# Patient Record
Sex: Male | Born: 1945
Health system: Southern US, Community
[De-identification: ages and names within clinical notes are randomized; demographics above are authoritative.]

## PROBLEM LIST (undated history)

## (undated) DIAGNOSIS — R05 Cough: Secondary | ICD-10-CM

## (undated) DIAGNOSIS — K30 Functional dyspepsia: Secondary | ICD-10-CM

## (undated) DIAGNOSIS — I200 Unstable angina: Secondary | ICD-10-CM

## (undated) DIAGNOSIS — Z9889 Other specified postprocedural states: Secondary | ICD-10-CM

## (undated) DIAGNOSIS — I4510 Unspecified right bundle-branch block: Secondary | ICD-10-CM

## (undated) DIAGNOSIS — R011 Cardiac murmur, unspecified: Secondary | ICD-10-CM

## (undated) DIAGNOSIS — I214 Non-ST elevation (NSTEMI) myocardial infarction: Secondary | ICD-10-CM

## (undated) DIAGNOSIS — R0789 Other chest pain: Secondary | ICD-10-CM

## (undated) DIAGNOSIS — G629 Polyneuropathy, unspecified: Secondary | ICD-10-CM

## (undated) DIAGNOSIS — IMO0001 Reserved for inherently not codable concepts without codable children: Secondary | ICD-10-CM

## (undated) DIAGNOSIS — I340 Nonrheumatic mitral (valve) insufficiency: Secondary | ICD-10-CM

## (undated) DIAGNOSIS — I071 Rheumatic tricuspid insufficiency: Secondary | ICD-10-CM

## (undated) DIAGNOSIS — R208 Other disturbances of skin sensation: Secondary | ICD-10-CM

## (undated) DIAGNOSIS — E785 Hyperlipidemia, unspecified: Secondary | ICD-10-CM

## (undated) HISTORY — DX: Unspecified right bundle-branch block: I45.10

## (undated) HISTORY — DX: Hyperlipidemia, unspecified: E78.5

## (undated) HISTORY — DX: Other disturbances of skin sensation: R20.8

## (undated) HISTORY — DX: Cardiac murmur, unspecified: R01.1

## (undated) HISTORY — PX: SPINE SURGERY: SHX786

## (undated) HISTORY — DX: Other chest pain: R07.89

## (undated) HISTORY — DX: Rheumatic tricuspid insufficiency: I07.1

## (undated) HISTORY — DX: Reserved for inherently not codable concepts without codable children: IMO0001

## (undated) HISTORY — DX: Other specified postprocedural states: Z98.890

## (undated) HISTORY — DX: Cough: R05

## (undated) HISTORY — DX: Polyneuropathy, unspecified: G62.9

## (undated) HISTORY — DX: Nonrheumatic mitral (valve) insufficiency: I34.0

## (undated) HISTORY — DX: Functional dyspepsia: K30

---
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
## 2001-05-29 ENCOUNTER — Observation Stay (HOSPITAL_COMMUNITY): Admission: RE | Admit: 2001-05-29 | Discharge: 2001-05-30 | Payer: Self-pay | Admitting: Neurosurgery

## 2001-06-28 ENCOUNTER — Encounter: Admission: RE | Admit: 2001-06-28 | Discharge: 2001-06-28 | Payer: Self-pay | Admitting: Neurosurgery

## 2002-07-26 DIAGNOSIS — IMO0001 Reserved for inherently not codable concepts without codable children: Secondary | ICD-10-CM

## 2002-07-26 HISTORY — DX: Reserved for inherently not codable concepts without codable children: IMO0001

## 2005-01-28 ENCOUNTER — Encounter: Admission: RE | Admit: 2005-01-28 | Discharge: 2005-01-28 | Payer: Self-pay | Admitting: Emergency Medicine

## 2005-05-31 DIAGNOSIS — IMO0001 Reserved for inherently not codable concepts without codable children: Secondary | ICD-10-CM

## 2005-05-31 HISTORY — DX: Reserved for inherently not codable concepts without codable children: IMO0001

## 2006-12-02 ENCOUNTER — Encounter: Admission: RE | Admit: 2006-12-02 | Discharge: 2006-12-02 | Payer: Self-pay | Admitting: Emergency Medicine

## 2009-06-12 ENCOUNTER — Ambulatory Visit: Payer: Self-pay | Admitting: Vascular Surgery

## 2009-09-10 ENCOUNTER — Encounter: Admission: RE | Admit: 2009-09-10 | Discharge: 2009-09-10 | Payer: Self-pay | Admitting: Emergency Medicine

## 2010-03-04 ENCOUNTER — Inpatient Hospital Stay (HOSPITAL_COMMUNITY): Admission: RE | Admit: 2010-03-04 | Discharge: 2010-03-04 | Payer: Self-pay | Admitting: Neurosurgery

## 2010-12-21 LAB — SURGICAL PCR SCREEN

## 2010-12-21 LAB — BASIC METABOLIC PANEL
GFR calc non Af Amer: >60 mL/min (ref >60)
Glucose, Bld: 89 mg/dL (ref 70–99)
Potassium: 4.1 mEq/L (ref 3.5–5.1)
Sodium: 137 mEq/L (ref 135–145)

## 2010-12-21 LAB — CBC
Hemoglobin: 13.8 g/dL (ref 13.0–17.0)
MCHC: 34.7 g/dL (ref 30.0–36.0)
RBC: 4.4 MIL/uL (ref 4.22–5.81)

## 2011-02-16 NOTE — Procedures (Signed)
CAROTID DUPLEX EXAM   INDICATION:  Carotid bruit.   HISTORY:  Diabetes:  No.  Cardiac:  No.  Hypertension:  No.  Smoking:  Previous.  Previous Surgery:  No.  CV History:  Currently asymptomatic.  Amaurosis Fugax No, Paresthesias No, Hemiparesis No                                       RIGHT             LEFT  Brachial systolic pressure:         134               132  Brachial Doppler waveforms:         Normal            Normal  Vertebral direction of flow:        Antegrade         Antegrade  DUPLEX VELOCITIES (cm/sec)  CCA peak systolic                   138               137  ECA peak systolic                   107               133  ICA peak systolic                   100               150  ICA end diastolic                   30                36  PLAQUE MORPHOLOGY:                  Mixed             Mixed  PLAQUE AMOUNT:                      Mild              Mild/moderate  PLAQUE LOCATION:                    ECA               ICA/ECA   IMPRESSION:  1. No evidence of stenosis noted in the right internal carotid artery.  2. 40-59% stenosis of the left internal carotid artery.  3. A preliminary report was faxed to Dr. Harvie Bridge office on      06/12/2009.   ___________________________________________  Quita Skye. Hart Rochester, M.D.   CH/MEDQ  D:  06/13/2009  T:  06/13/2009  Job:  [id number]

## 2011-02-19 NOTE — Op Note (Signed)
[place]  Patient:    Mark Garner, Mark Garner Visit Number: [id number] MRN: [id number]          Service Type: SUR Location: 3000 3022 01 Attending Physician:  Cristi Loron Dictated by:   Cristi Loron, M.D. Proc. Date: 05/29/01 Admit Date:  05/29/2001 Discharge Date: 05/30/2001                             Operative Report  BRIEF HISTORY:  The patient is a 65 year old white male who has suffered from neck and right arm pain.  He has failed medical management and was worked up as an outpatient with a cervical MRI that demonstrates significant spondylosis and neural foraminal stenosis at C5-6.  The patient, therefore, weighed the risks, benefits, and alternatives to surgery and decided to proceed with the C5-6 anterior cervical diskectomy with fusion and plating.  PREOPERATIVE DIAGNOSES:   C5-6 spondylosis, spinal stenosis, degenerative disk disease, cervical radiculopathy and cervicalgia.  POSTOPERATIVE DIAGNOSES:  C5-6 spondylosis, spinal stenosis, degenerative disk disease, cervical radiculopathy and cervicalgia.  OPERATION:  C5-6 extensive anterior cervical diskectomy, interbody iliac crest allograft arthrodesis, anterior cervical plating (Codman titanium plate and screws).  SURGEON:  Cristi Loron, M.D.  ASSISTANT:  Stefani Dama, M.D.  ANESTHESIA:  General endotracheal anesthesia.  ESTIMATED BLOOD LOSS:  Less than 100 cc.  SPECIMENS:  None.  DRAINS:  None.  COMPLICATIONS:  None.  DESCRIPTION OF PROCEDURE:  The patient was brought to the operating room by anesthesia team.  General endotracheal anesthesia was induced.  The patient remained in a supine position.  A roll was placed under his shoulders to place his neck in slight extension.  His anterior cervical region was then prepared with Betadine scrub and Betadine solution.  Sterile drapes were applied.  I then injected the area to be incised with Marcaine with  epinephrine solution. I then used a scalpel to make a transverse incision in the patients left anterior neck.  I used the Metzenbaum scissors to divide the the platysma muscle and then dissected medial to the sternocleidomastoid muscle, jugular vein, and carotid artery.  I bluntly dissected towards the anterior cervical spine, identified the esophagus, and carefully retracted it medially.  I then used the Kitner gloves to remove some soft tissue from the anterior cervical spine and inserted a spinal needle into the exposed interspace.  I obtained intraoperative radiograph that demonstrated the needle was at C5-6.  I detached the medial border of the longus coli muscle bilaterally from the C5-6 interspace and inserted the Caspar self-retaining retractor for exposure. I then incised the C5-6 intervertebral disk and performed a partial diskectomy using a pituitary forceps.  There was considerable anterior spondylosis.  I used a high-speed drill to remove some anterior bone spurs to gain better access to the interspace.  I inserted distraction screws at C5 and C6 and distracted the interspace and used the high-speed drill to decorticate the vertebral end plates at A2-1 and drill away the remainder of the intervertebral disk.  I thinned out the posterior and longitudinal ligament with the drill, and then I incised it with the arachnoid knife.  I removed the ligament with the Kerrison punch, undercutting the vertebral end plates at H0-8, removing some more spondylosis.  I identified the bilateral C6 nerve roots and performed a generous foraminotomy about the nerve root bilaterally. At this point, I had a good decompression of the thecal sac  and the bilateral C6 nerve roots.  I then turned my attention to the arthrodesis.  I obtained a 9 mm iliac crest bone graft and fashioned it to these approximate dimensions: 9 mm in height, 1 cm in depth.  I inserted the bone graft and distracted the C5-6  interspace, removed the distraction screws.  There was a good snug fit of the bone graft.  Having completed the arthrodesis, I now turned my attention to the anterior spine.  I obtained the appropriate length Codman anterior cervical plate, laid it along the anterior aspect of the vertebral bodies at C5-6, drilled two holes at C5, two holes at C6, tapped the holes and secured the plate to the vertebral bodies using 12 mm screws.  I then obtained the intraoperative radiograph which demonstrated good positioning of plate, screws, and interbody graft.  I then secured the screws to the plate using a cam tightener.  I copiously irrigated the wound with bacitracin solution, removed the solution, and achieved good hemostasis with bipolar electrocautery and Gelfoam.  I then removed the Caspar self-retaining retractor and inspected the esophagus for any damage.  There was none apparent.  I then reapproximated the patients platysma muscle with interrupted 3-0 Vicryl suture, subcutaneous tissue with interrupted 3-0 Vicryl suture, and the skin with Steri-Strips and benzoin. The wound was then coated with bacitracin ointment.  A sterile dressing was applied.  The drapes were removed, and the patient was subsequently extubated by the anesthesia team and transported to the postanesthesia care unit in stable condition.  All sponge, instrument, and needle counts were correct at the end of this case. Dictated by:   Cristi Loron, M.D. Attending Physician:  Tressie Stalker D DD:  05/29/01 TD:  05/29/01 Job: [id number]

## 2011-02-19 NOTE — H&P (Signed)
[place]  Patient:    Mark Garner, Mark Garner Visit Number: [id number] MRN: [id number]          Service Type: SUR Location: 3000 3022 01 Attending Physician:  Cristi Loron Dictated by:   Cristi Loron, M.D. Adm. Date:  05/29/2001                           History and Physical  CHIEF COMPLAINT: Neck pain, right arm pain.  HISTORY OF PRESENT ILLNESS: The patient is a 65 year old white male, who has had trouble with right-sided neck pain radiating down his right arm for years. He has been treated medically and has failed medical management.  He was worked up with a cervical MRI, which demonstrated significant spondylosis and neural foraminal stenosis at C5-6 on the right.  The patient therefore weighed the risks and benefits and alternatives of surgery and decided to proceed with anterior cervical diskectomy with fusion and plating.  PAST MEDICAL HISTORY: Gastritis.  PAST SURGICAL HISTORY: None.  MEDICATIONS PRIOR TO ADMISSION: P.r.n. vitamins.  ALLERGIES: No known drug allergies.  FAMILY HISTORY: The patients mother is age 67 and in good health.  The patients father drowned at age 80.  He has one brother with a heart attack and diabetes mellitus, another brother with a TIA.  SOCIAL HISTORY: The patient is married.  He has one son.  He lives in [place].  He denies tobacco and drug use.  He rarely drinks alcohol.  REVIEW OF SYSTEMS: Negative except as above.  PHYSICAL EXAMINATION:  GENERAL: Pleasant, well-nourished, well-developed 65 year old white male complaining of neck and right arm pain.  VITAL SIGNS: Height 5 feet 9 inches.  Weight 160 pounds.  HEENT: Normal.  NECK: Supple.  No masses, deformities, tracheal deviation, jugular venous distention.  He has mildly decreased cervical range of motion.  Spurlings test positive on the right, negative on the left.  Lhermittes sign not present.  Reflexes  symmetric.  LUNGS: Clear to auscultation.  HEART: Regular rate and rhythm.  ABDOMEN: Normal.  EXTREMITIES: Good bilateral shoulder and elbow range of motion.  No tenderness to palpation or deformities.  BACK: Normal.  NEUROLOGIC: The patient is alert and oriented x 3.  Cranial nerves grossly intact bilaterally.  Vision and hearing loss grossly normal bilaterally. Motor strength is 5/5 at bilateral deltoids, biceps, triceps, hand grips, wrist extensors, interosseous psoas, quadriceps, gastrocnemius, and extensor hallucis longus.  Cerebellar examination intact to rapid alternating movements of the upper extremities bilaterally.  Sensory examination was normal to light touch and pinprick sensation in all tested dermatomes bilaterally.  Deep tendon reflexes were 2/4 in the bilateral biceps, triceps, brachial radialis, quadriceps, and gastrocnemius.  He has bilateral flexor and plantar reflexes. No ankle clonus.  DIAGNOSTIC STUDIES: Cervical MRI performed without contrast on April 16, 2001 at [facility] demonstrated cervical spine with significant spondylosis and right neural foraminal stenosis at C5-6, some mild spondylosis at C6-7 without significant neural compression.  ASSESSMENT/PLAN: C5-6 degenerative disk disease with spondylosis and spinal stenosis at the cervical canal, with degenerative cervical radiation.  I have discussed the situation with the patient and reviewed his MRI scan with him, pointing out the abnormalities.  His signs and symptoms and physical examination were consistent with a right C6 radiculopathy caused by bone spur at C5-6.  He has not improved despite time and medical management and we discussed the treatment options of continued medical management,  doing nothing, as well as surgery.  I described the procedure of a C5-6 anterior cervical diskectomy with interbody iliac crest allograft arthrodesis with anterior cervical plating.  I have shown him  surgical models and discussed the risks of surgery extensively.  The patient has weighed the risks and benefits and alternatives of surgery, and wants to proceed with C5-6 anterior cervical diskectomy with interbody iliac crest allograft arthrodesis with anterior cervical plating on May 29, 2001. Dictated by:   Cristi Loron, M.D. Attending Physician:  Tressie Stalker D DD:  05/29/01 TD:  05/29/01 Job: [id number]

## 2011-03-16 ENCOUNTER — Encounter: Payer: Self-pay | Admitting: Cardiovascular Disease

## 2011-03-16 ENCOUNTER — Telehealth: Payer: Self-pay | Admitting: Cardiovascular Disease

## 2011-03-16 NOTE — Telephone Encounter (Signed)
Called stating he has noticed recently that he gets more SOB when rides bicycle and some occ chest pressure. First app w/ Dr. Elease Hashimoto is in July. Offered him an app w/ Lawson Fiscal. See will see him 6/15. Had nml stress and echo.

## 2011-03-16 NOTE — Telephone Encounter (Signed)
PT MADE APPT FOR July (1ST AVAILABLE) BUT SAYS HE IS HAVING SOME ISSUES AND WANTS TO RUN THEM BY THE NURSE. CHART IN BOX.

## 2011-03-19 ENCOUNTER — Ambulatory Visit: Payer: Self-pay | Admitting: Nurse Practitioner

## 2011-03-30 ENCOUNTER — Encounter: Payer: Self-pay | Admitting: Cardiovascular Disease

## 2011-04-06 ENCOUNTER — Ambulatory Visit (INDEPENDENT_AMBULATORY_CARE_PROVIDER_SITE_OTHER): Payer: Medicare Other | Admitting: Cardiovascular Disease

## 2011-04-06 ENCOUNTER — Encounter: Payer: Self-pay | Admitting: Cardiovascular Disease

## 2011-04-06 DIAGNOSIS — R0789 Other chest pain: Secondary | ICD-10-CM | POA: Insufficient documentation

## 2011-04-06 DIAGNOSIS — R079 Chest pain, unspecified: Secondary | ICD-10-CM

## 2011-04-06 HISTORY — DX: Other chest pain: R07.89

## 2011-04-06 NOTE — Assessment & Plan Note (Signed)
His chest pain is fairly atypical. It sounds more like musculoskeletal pain. He's able to exercise at a very high level without any significant pains.   We'll see him again in one year.

## 2011-04-06 NOTE — Progress Notes (Signed)
Mark Garner Date of Birth  Dec 25, 1945 [facility] [address]     [address] [place], [place]  [address] [phone]  Fax  [phone]  History of Present Illness:  Middle age man with history of cp. He is a competitive cyclist. He's able drive for hours at a time and has not had any significant problems. Had a brief episode of dyspnea while cycling up a steep hill.  Otherwise he is doing well.    Current Outpatient Prescriptions on File Prior to Visit  Medication Sig Dispense Refill  . acetaminophen (TYLENOL) 500 MG tablet Take 500 mg by mouth every 6 (six) hours as needed.        . ranitidine (ZANTAC) 150 MG tablet Take 150 mg by mouth at bedtime.        Marland Kitchen DISCONTD: niacin (NIASPAN) 500 MG CR tablet Take 500 mg by mouth at bedtime.        Marland Kitchen DISCONTD: omega-3 acid ethyl esters (LOVAZA) 1 G capsule Take 2 g by mouth 3 (three) times daily.        Marland Kitchen DISCONTD: omeprazole (PRILOSEC) 40 MG capsule Take 40 mg by mouth daily.          Allergies  Allergen Reactions  . Gemfibrozil Other (See Comments)    Leg pain  . Prilosec (Omeprazole Magnesium)   . Tricor Other (See Comments)    Leg pain    Past Medical History  Diagnosis Date  . Hyperlipidemia   . Right bundle branch block   . Normal echocardiogram 05/31/05    normal LV function; trace MR;trace tricuspid regurgitation  . Normal nuclear stress test 07/26/02    no evidence of ischemia; nml LVF  . Chest pain   . Heart murmur   . Trace tricuspid regurgitation by prior echocardiogram   . Indigestion   . Trace mitral regurgitation by prior echocardiogram     No past surgical history on file.  History  Smoking status  . Former Smoker -- 1.0 packs/day for 7 years  . Types: Cigarettes  . Quit date: 10/04/1984  Smokeless tobacco  . Never Used    History  Alcohol Use No    Family History  Problem Relation Age of Onset  . Heart disease Brother 21    MI/ptca    Reviw of  Systems:  Reviewed in the HPI.  All other systems are negative.  Physical Exam: BP 150/78  Pulse 66  Ht 5\' 9"  (1.753 m)  Wt 170 lb (77.111 kg)  BMI 25.10 kg/m2 The patient is alert and oriented x 3.  The mood and affect are normal.   Skin: warm and dry.  Color is normal.    HEENT:   the sclera are nonicteric.  The mucous membranes are moist.  The carotids are 2+ without bruits.  There is no thyromegaly.  There is no JVD.    Lungs: clear.  The chest wall is non tender.    Heart: regular rate with a normal S1 and S2.  There are no murmurs, gallops, or rubs. The PMI is not displaced.     Abdomin: good bowel sounds.  There is no guarding or rebound.  There is no hepatosplenomegaly or tenderness.  There are no masses.   Extremities:  no clubbing, cyanosis, or edema.  The legs are without rashes.  The distal pulses are intact.   Neuro:  Cranial nerves II - XII are intact.  Motor and sensory  functions are intact.    The gait is normal.  ECG: Normal sinus rhythm. He has a right bundle branch block. EKG is unchanged from previous exams.  Assessment / Plan:

## 2011-04-08 ENCOUNTER — Encounter: Payer: Self-pay | Admitting: Cardiovascular Disease

## 2012-09-11 ENCOUNTER — Other Ambulatory Visit: Payer: Self-pay | Admitting: Family Medicine

## 2012-09-11 DIAGNOSIS — Z136 Encounter for screening for cardiovascular disorders: Secondary | ICD-10-CM

## 2012-09-22 ENCOUNTER — Inpatient Hospital Stay: Admission: RE | Admit: 2012-09-22 | Payer: Medicare Other | Source: Ambulatory Visit

## 2013-01-02 ENCOUNTER — Ambulatory Visit
Admission: RE | Admit: 2013-01-02 | Discharge: 2013-01-02 | Disposition: A | Discharge status: Home / Self Care | Payer: Medicare Other | Source: Ambulatory Visit | Attending: Family Medicine | Admitting: Family Medicine

## 2013-01-02 DIAGNOSIS — Z136 Encounter for screening for cardiovascular disorders: Secondary | ICD-10-CM

## 2013-03-26 ENCOUNTER — Telehealth: Payer: Self-pay | Admitting: Cardiovascular Disease

## 2013-03-26 NOTE — Telephone Encounter (Signed)
Walk in pt Form " Records Dropped Off" By pt gave to [facility] 03/26/13/KM

## 2013-06-07 ENCOUNTER — Encounter: Payer: Self-pay | Admitting: Cardiovascular Disease

## 2013-06-15 ENCOUNTER — Ambulatory Visit (INDEPENDENT_AMBULATORY_CARE_PROVIDER_SITE_OTHER): Payer: Medicare Other | Admitting: Cardiovascular Disease

## 2013-06-15 ENCOUNTER — Encounter: Payer: Self-pay | Admitting: Cardiovascular Disease

## 2013-06-15 VITALS — BP 134/84 | HR 61 | Ht 69.0 in | Wt 170.0 lb

## 2013-06-15 DIAGNOSIS — R0789 Other chest pain: Secondary | ICD-10-CM

## 2013-06-15 DIAGNOSIS — E785 Hyperlipidemia, unspecified: Secondary | ICD-10-CM

## 2013-06-15 DIAGNOSIS — I4510 Unspecified right bundle-branch block: Secondary | ICD-10-CM
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
## 2013-06-15 NOTE — Patient Instructions (Addendum)
Your physician wants you to follow-up in: 1 year  You will receive a reminder letter in the mail two months in advance. If you don't receive a letter, please call our office to schedule the follow-up appointment.   Your physician recommends that you return for a FASTING lipid profile: 1 year   
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
## 2014-06-03 ENCOUNTER — Encounter: Payer: Self-pay | Admitting: Cardiovascular Disease

## 2014-06-11 ENCOUNTER — Encounter: Payer: Self-pay | Admitting: Cardiovascular Disease

## 2014-06-11 ENCOUNTER — Ambulatory Visit (INDEPENDENT_AMBULATORY_CARE_PROVIDER_SITE_OTHER): Payer: Medicare Other | Admitting: Cardiovascular Disease

## 2014-06-11 VITALS — BP 120/86 | HR 62 | Ht 69.0 in | Wt 169.0 lb

## 2014-06-11 DIAGNOSIS — E785 Hyperlipidemia, unspecified: Secondary | ICD-10-CM | POA: Insufficient documentation

## 2014-06-11 DIAGNOSIS — I4510 Unspecified right bundle-branch block: Secondary | ICD-10-CM

## 2014-06-11 DIAGNOSIS — R0789 Other chest pain: Secondary | ICD-10-CM
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
## 2014-06-11 NOTE — Assessment & Plan Note (Signed)
Stable

## 2014-06-11 NOTE — Patient Instructions (Addendum)
Your physician recommends that you return for lab work in: on Friday 9/11 anytime between 7:30 am and 4:30 pm You will need to FAST for this appointment - nothing to eat or drink after midnight the night before except water.  Your physician recommends that you continue on your current medications as directed. Please refer to the Current Medication list given to you today.  Your physician wants you to follow-up in: 1 year with Dr. Acie Fredrickson.  You will receive a reminder letter in the mail two months in advance. If you don't receive a letter, please call our office to schedule the follow-up appointment.

## 2014-06-11 NOTE — Assessment & Plan Note (Signed)
Will check fasting lipids on Friday.

## 2014-06-11 NOTE — Assessment & Plan Note (Signed)
Very atypical.  Cycling without problems. I do not think he has any significant CAD;

## 2014-06-13 ENCOUNTER — Telehealth: Payer: Self-pay | Admitting: Cardiovascular Disease

## 2014-06-13 NOTE — Telephone Encounter (Signed)
Pt called because he is scheduled for labs Lipid panel, BMET and LFT on 06/14/14 in this office . Pt had labs done at his PCP's office with [name] last week: Free T4, CMET, TSH and CBC. Pt states he does not want to have a repeat lab done this coming Friday. Pt is aware that he will need the Lipid panel done here. [facility] will fax labs  results done there to this office.

## 2014-06-13 NOTE — Telephone Encounter (Signed)
New Message  Pt called states that he has had a lab appt with pcp. Pt request a call back to discuss if any of the labs are considered duplicates being that he had labs completed with pcp. Please call  Dibas Koirala [facility] [place] [phone]

## 2014-06-14 ENCOUNTER — Other Ambulatory Visit (INDEPENDENT_AMBULATORY_CARE_PROVIDER_SITE_OTHER): Payer: Medicare Other

## 2014-06-14 DIAGNOSIS — E785 Hyperlipidemia, unspecified: Secondary | ICD-10-CM

## 2014-06-14 LAB — LIPID PANEL
CHOL/HDL RATIO: 6
Cholesterol: 166 mg/dL (ref 0–200)
HDL: 26.9 mg/dL — ABNORMAL LOW (ref >39.00)
NONHDL: 139.1
TRIGLYCERIDES: 336 mg/dL — AB (ref 0.0–149.0)
VLDL: 67.2 mg/dL — ABNORMAL HIGH (ref 0.0–40.0)

## 2014-06-14 LAB — LDL CHOLESTEROL, DIRECT: LDL DIRECT: 80.7 mg/dL

## 2014-06-17 ENCOUNTER — Telehealth: Payer: Self-pay | Admitting: Nurse Practitioner

## 2014-06-17 NOTE — Telephone Encounter (Signed)
Spoke with patient and reviewed lab results and plan of care.  Upon discussion and chart review, I realized patient is intolerant of Tricor from past - pt states he took for 4 years then began to have severe musculoskeletal pain.  I discussed this with Dr. Acie Fredrickson who advised referral to lipid clinic.  Patient verbalized understanding and agreement and is scheduled for 9/16.

## 2014-06-19 ENCOUNTER — Ambulatory Visit (INDEPENDENT_AMBULATORY_CARE_PROVIDER_SITE_OTHER): Payer: Medicare Other | Admitting: Pharmacist

## 2014-06-19 VITALS — Wt 168.0 lb

## 2014-06-19 DIAGNOSIS — E785 Hyperlipidemia, unspecified: Secondary | ICD-10-CM

## 2014-06-19 MED ORDER — OMEGA-3-ACID ETHYL ESTERS 1 G PO CAPS: 2.0000 g | ORAL_CAPSULE | Freq: Two times a day (BID) | ORAL | Status: DC

## 2014-06-19 NOTE — Patient Instructions (Signed)
1.  Start generic Lovaza fish oil and take 4 capsules daily - 2 AM / 2 PM - take with food.  If GI upset, okay to put in refrigerator. 2.  Continue low fat and exercise regimen. 3.  If Lovaza cost is too much or problems with it, Nordic Naturals or Carlson's brand fish oil should be used - 4 per day ([employer], [employer], or [employer]) 4.  Recheck cholesterol and liver on 09/19/14.  Fasting lab, lab opens at 7:30 am, show up anytime after.
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
## 2014-06-19 NOTE — Assessment & Plan Note (Addendum)
Patient hypertriglyceridemia appears to be more of genetic origin. Unfortunately he can't tolerate fenofibrate which is typically the most successful in this circumstance as it increases lipoprotein lipase, however will instead put patient on higher potency fish oil at 4 g/d.  Will send in prescription for Lovaza 4 g/d, but if this is covered for any reason, he will instead start taking either Nordic Naturals fish oil or Carlson's fish oil at 4 g/d.  Informed patient where he could purchase these.  Will recheck lipid panel and hepatic panel in 3 months as already scheduled by Dr. Acie Fredrickson.

## 2014-06-24 ENCOUNTER — Telehealth: Payer: Self-pay | Admitting: Pharmacist

## 2014-06-24 MED ORDER — FISH OIL 1000 MG PO CAPS: 4000.0000 mg | ORAL_CAPSULE | Freq: Every day | ORAL | Status: DC

## 2014-06-24 NOTE — Telephone Encounter (Signed)
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  Patient will take 5 capsules daily.  Recheck blood work as scheduled later this year.  Med list updated.

## 2014-09-12 ENCOUNTER — Ambulatory Visit (INDEPENDENT_AMBULATORY_CARE_PROVIDER_SITE_OTHER)
Admission: RE | Admit: 2014-09-12 | Discharge: 2014-09-12 | Disposition: A | Discharge status: Home / Self Care | Payer: Medicare Other | Source: Ambulatory Visit | Attending: Internal Medicine | Admitting: Internal Medicine

## 2014-09-12 ENCOUNTER — Encounter: Payer: Self-pay | Admitting: Internal Medicine

## 2014-09-12 ENCOUNTER — Ambulatory Visit (INDEPENDENT_AMBULATORY_CARE_PROVIDER_SITE_OTHER): Payer: Medicare Other | Admitting: Internal Medicine

## 2014-09-12 VITALS — BP 124/70 | HR 52 | Ht 69.0 in | Wt 170.0 lb

## 2014-09-12 DIAGNOSIS — R058 Other specified cough: Secondary | ICD-10-CM | POA: Insufficient documentation

## 2014-09-12 DIAGNOSIS — R05 Cough: Secondary | ICD-10-CM

## 2014-09-12 HISTORY — DX: Other specified cough: R05.8

## 2014-09-12 MED ORDER — RABEPRAZOLE SODIUM 20 MG PO TBEC: 20.0000 mg | DELAYED_RELEASE_TABLET | Freq: Every day | ORAL | Status: DC
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
## 2014-09-12 NOTE — Patient Instructions (Addendum)
Aciphex 20  Take 30-60 min before first meal of the day and Pepcid ac 20 mg one bedtime for a month  GERD (REFLUX)  is an extremely common cause of respiratory symptoms just like yours , many times with no obvious heartburn at all.    It can be treated with medication, but also with lifestyle changes including avoidance of late meals, excessive alcohol, smoking cessation, and avoid fatty foods, chocolate, peppermint, colas, red wine, and acidic juices such as orange juice.  NO MINT OR MENTHOL PRODUCTS SO NO COUGH DROPS  USE SUGARLESS CANDY INSTEAD (Jolley ranchers or Stover's or Life Savers) or even ice chips will also do - the key is to swallow to prevent all throat clearing. NO OIL BASED VITAMINS - use powdered substitutes.   Zyrtec is good but For persistent drainage take chlortrimeton (chlorpheniramine) 4 mg every 4 hours available over the counter (may cause drowsiness)   If not better return in 4 weeks to regroup

## 2014-09-13 ENCOUNTER — Telehealth: Payer: Self-pay | Admitting: *Deleted

## 2014-09-13 NOTE — Progress Notes (Signed)
Quick Note:  Spoke with pt and notified of results per Dr. Melvyn Novas. Pt verbalized understanding and denied any questions. He states that the xray tech realized the glasses were there and cxr was repeated already Will forward back to MW so he is aware ______

## 2014-09-13 NOTE — Telephone Encounter (Signed)
PA initiated with Elita Quick with [facility] for Aciphex (Rabeprazole) 20mg  - 1 po QD Approved 09/13/2014 - 10/04/2015  Pt aware this is covered.  Nothing further needed.

## 2014-09-15 NOTE — Assessment & Plan Note (Signed)
Most likley this is an unusual presentation of    Upper airway cough syndrome, so named because it's frequently impossible to sort out how much is  CR/sinusitis with freq throat clearing (which can be related to primary GERD)   vs  causing  secondary (" extra esophageal")  GERD from wide swings in gastric pressure that occur with throat clearing, often  promoting self use of mint and menthol lozenges that reduce the lower esophageal sphincter tone and exacerbate the problem further in a cyclical fashion.   These are the same pts (now being labeled as having "irritable larynx syndrome" by some cough centers) who not infrequently have a history of having failed to tolerate ace inhibitors,  dry powder inhalers or biphosphonates or report having atypical reflux symptoms that don't respond to standard doses of PPI , and are easily confused as having aecopd or asthma flares by even experienced allergists/ pulmonologists.   For now would just rx for gerd and reassure that there does not appear to be any primary pulmonary issue as strongly supported by absence of symptoms with intense aerobic activity and sleep.

## 2014-09-19 ENCOUNTER — Other Ambulatory Visit (INDEPENDENT_AMBULATORY_CARE_PROVIDER_SITE_OTHER): Payer: Medicare Other | Admitting: *Deleted

## 2014-09-19 DIAGNOSIS — E785 Hyperlipidemia, unspecified: Secondary | ICD-10-CM

## 2014-09-19 DIAGNOSIS — I4510 Unspecified right bundle-branch block: Secondary | ICD-10-CM

## 2014-09-19 DIAGNOSIS — R0789 Other chest pain: Secondary | ICD-10-CM

## 2014-09-19 LAB — HEPATIC FUNCTION PANEL
ALK PHOS: 38 U/L — AB (ref 39–117)
ALT: 21 U/L (ref 0–53)
AST: 25 U/L (ref 0–37)
Albumin: 4 g/dL (ref 3.5–5.2)
BILIRUBIN DIRECT: 0.1 mg/dL (ref 0.0–0.3)
BILIRUBIN TOTAL: 0.8 mg/dL (ref 0.2–1.2)
Total Protein: 6.6 g/dL (ref 6.0–8.3)

## 2014-09-19 LAB — BASIC METABOLIC PANEL
BUN: 15 mg/dL (ref 6–23)
CHLORIDE: 108 meq/L (ref 96–112)
CO2: 22 meq/L (ref 19–32)
CREATININE: 0.9 mg/dL (ref 0.4–1.5)
Calcium: 9 mg/dL (ref 8.4–10.5)
GFR: 90.97 mL/min (ref >60.00)
GLUCOSE: 102 mg/dL — AB (ref 70–99)
POTASSIUM: 4.1 meq/L (ref 3.5–5.1)
Sodium: 138 mEq/L (ref 135–145)

## 2014-09-19 LAB — LIPID PANEL
CHOL/HDL RATIO: 8
CHOLESTEROL: 204 mg/dL — AB (ref 0–200)
HDL: 27.1 mg/dL — ABNORMAL LOW (ref >39.00)
LDL CALC: 144 mg/dL — AB (ref 0–99)
NonHDL: 176.9
Triglycerides: 165 mg/dL — ABNORMAL HIGH (ref 0.0–149.0)
VLDL: 33 mg/dL (ref 0.0–40.0)

## 2014-09-23 ENCOUNTER — Telehealth: Payer: Self-pay | Admitting: Cardiovascular Disease

## 2014-09-23 NOTE — Telephone Encounter (Signed)
New Msg      Pt returning call from Friday, would like to know lab results.

## 2014-09-23 NOTE — Telephone Encounter (Signed)
Returned patient's call about his labs. Informed him of Dr. Elmarie Shiley result note as below. Patient verbalized understanding. No further questions at this time.  Notes Recorded by Thayer Headings, MD on 09/19/2014 at 6:05 PM Trigs are better. Otherwise, labs are stable

## 2014-12-19 ENCOUNTER — Telehealth: Payer: Self-pay | Admitting: Cardiovascular Disease

## 2014-12-19 NOTE — Telephone Encounter (Signed)
Spoke with patient who states he has noticed chest and shoulder pressure now with exercise whereas previously did not have any discomfort with exercise.  Patient states previously he had some chest discomfort which was GI related; state he is comfortable with waiting until tomorrow to see Dr. Acie Fredrickson.

## 2014-12-19 NOTE — Telephone Encounter (Signed)
Left message for patient to call me at the office; advised that I was calling to make certain he is comfortable with his appointment time and to get more information about his symptoms
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
## 2014-12-20 ENCOUNTER — Ambulatory Visit (INDEPENDENT_AMBULATORY_CARE_PROVIDER_SITE_OTHER): Payer: Medicare Other | Admitting: Cardiovascular Disease

## 2014-12-20 ENCOUNTER — Encounter: Payer: Self-pay | Admitting: Cardiovascular Disease

## 2014-12-20 VITALS — BP 140/80 | HR 70 | Ht 69.0 in | Wt 171.4 lb

## 2014-12-20 DIAGNOSIS — R0789 Other chest pain: Secondary | ICD-10-CM

## 2014-12-20 DIAGNOSIS — E785 Hyperlipidemia, unspecified: Secondary | ICD-10-CM

## 2014-12-20 NOTE — Progress Notes (Signed)
Cardiology Office Note   Date:  12/20/2014   ID:  Mark Garner, DOB 1946/06/02, MRN [id number]  PCP:  Lujean Amel, MD  Cardiologist:   Thayer Headings, MD   Chief Complaint  Patient presents with  . Chest Pain   1. Chest pain 2. RBBB 3. hyperlipidemia  History of Present Illness:  Middle age man with history of cp. He is a competitive cyclist. He's able drive for hours at a time and has not had any significant problems. Had a brief episode of dyspnea while cycling up a steep hill. Otherwise he is doing well.   Sept. 12, 2014:  [name] is doing well. Still cycling some. He was found to have mild hyperlipidemia and was tried on several statins. One caused muscle aches . He is now on Pravachol and is tolerating it fairly well.   Sept. 8, 2015:  He is back riding some. Usually rides 15 miles a day.  Has been a stressful month. Has been busy.   December 20, 2014: Mark Garner is a 69 y.o. male who presents for chest pain .  He is a regular cyclist.  He has been having some left arm pain with exertion over the past several weeks.  Several days later, he had another episode of CP and left arm pain  Pain.  Knot like pain, shoulder, neck, pain  He has been doing yard work recently and did not have any problems.     Past Medical History  Diagnosis Date  . Hyperlipidemia   . Right bundle branch block   . Normal echocardiogram 05/31/05    normal LV function; trace MR;trace tricuspid regurgitation  . Normal nuclear stress test 07/26/02    no evidence of ischemia; nml LVF  . Chest pain   . Heart murmur   . Trace tricuspid regurgitation by prior echocardiogram   . Indigestion   . Trace mitral regurgitation by prior echocardiogram     Past Surgical History  Procedure Laterality Date  . Spine surgery      x 2      Current Outpatient Prescriptions  Medication Sig Dispense Refill  . acetaminophen (TYLENOL) 500 MG tablet Take 500 mg by mouth every 6 (six)  hours as needed.      . Omega-3 Fatty Acids (FISH OIL) 1000 MG CAPS Take 4 capsules (4,000 mg total) by mouth daily.  0  . RABEprazole (ACIPHEX) 20 MG tablet Take 1 tablet (20 mg total) by mouth daily. 30 tablet 2  . Simethicone (GAS-X EXTRA STRENGTH PO) Take 1 capsule by mouth as needed.       No current facility-administered medications for this visit.    Allergies:   Fenofibrate; Gemfibrozil; and Niacin and related    Social History:  The patient  reports that he quit smoking about 30 years ago. His smoking use included Cigarettes. He has a 5 pack-year smoking history. He has never used smokeless tobacco. He reports that he does not drink alcohol or use illicit drugs.   Family History:  The patient's family history includes Heart disease (age of onset: 68) in his brother.    ROS:  Please see the history of present illness.    Review of Systems: Constitutional:  denies fever, chills, diaphoresis, appetite change and fatigue.  HEENT: denies photophobia, eye pain, redness, hearing loss, ear pain, congestion, sore throat, rhinorrhea, sneezing, neck pain, neck stiffness and tinnitus.  Respiratory: denies SOB, DOE, cough, chest tightness, and wheezing.  Cardiovascular: denies  chest pain, palpitations and leg swelling.  Gastrointestinal: denies nausea, vomiting, abdominal pain, diarrhea, constipation, blood in stool.  Genitourinary: denies dysuria, urgency, frequency, hematuria, flank pain and difficulty urinating.  Musculoskeletal: denies  myalgias, back pain, joint swelling, arthralgias and gait problem.   Skin: denies pallor, rash and wound.  Neurological: denies dizziness, seizures, syncope, weakness, light-headedness, numbness and headaches.   Hematological: denies adenopathy, easy bruising, personal or family bleeding history.  Psychiatric/ Behavioral: denies suicidal ideation, mood changes, confusion, nervousness, sleep disturbance and agitation.       All other systems are  reviewed and negative.    PHYSICAL EXAM: VS:  BP 140/80 mmHg  Pulse 70  Ht 5\' 9"  (1.753 m)  Wt 171 lb 6.4 oz (77.747 kg)  BMI 25.30 kg/m2 , BMI Body mass index is 25.3 kg/(m^2). GEN: Well nourished, well developed, in no acute distress HEENT: normal Neck: no JVD, carotid bruits, or masses Cardiac: RRR; no murmurs, rubs, or gallops,no edema  Respiratory:  clear to auscultation bilaterally, normal work of breathing GI: soft, nontender, nondistended, + BS MS: no deformity or atrophy Skin: warm and dry, no rash Neuro:  Strength and sensation are intact Psych: normal   EKG:  EKG is ordered today. The ekg ordered today demonstrates NSR .  RBBB   Recent Labs: 09/19/2014: ALT 21; BUN 15; Creatinine 0.9; Potassium 4.1; Sodium 138    Lipid Panel    Component Value Date/Time   CHOL 204* 09/19/2014 0927   TRIG 165.0* 09/19/2014 0927   HDL 27.10* 09/19/2014 0927   CHOLHDL 8 09/19/2014 0927   VLDL 33.0 09/19/2014 0927   LDLCALC 144* 09/19/2014 0927   LDLDIRECT 80.7 06/14/2014 0856      Wt Readings from Last 3 Encounters:  12/20/14 171 lb 6.4 oz (77.747 kg)  09/12/14 170 lb (77.111 kg)  06/19/14 168 lb (76.204 kg)      Other studies Reviewed: Additional studies/ records that were reviewed today include: . Review of the above records demonstrates:    ASSESSMENT AND PLAN:  1.  Hyperlipidemia:  [name]  has had some recent lipids which revealed a mildly elevated LDL.   His triglyceride  level is much improved on the fish oil. Will recheck his levels next week.  We will continue current meds for now.    2. Chest discomfort:  He has had 2 episodes of CP related to cycling.  Left arm and shoulder pain. Radiation to neck. It lasted for several minutes and then resolved as he stopped to rest.   Will get a stress myoivew for further evaluation.   Current medicines are reviewed at length with the patient today.  The patient does not have concerns regarding medicines.  The  following changes have been made:  no change  Labs/ tests ordered today include:  No orders of the defined types were placed in this encounter.     Disposition:   FU with me in 1 year.  Will see him after the myoview if he has abnormalities.  Will see him sooner if he has more CP.      Signed, Garin Mata, Wonda Cheng, MD  12/20/2014 4:47 PM    [facility] [address], [place], [place]  [address] Phone: [phone]; Fax: [phone]

## 2014-12-20 NOTE — Patient Instructions (Signed)
Your physician recommends that you continue on your current medications as directed. Please refer to the Current Medication list given to you today.  Your physician recommends that you have lab work: TODAY - fasting cholesterol, liver, bmet  Your physician has requested that you have an exercise stress myoview. For further information please visit [email or web address]. Please follow instruction sheet, as given.  Your physician wants you to follow-up in: 1 year with Dr. Acie Fredrickson.  You will receive a reminder letter in the mail two months in advance. If you don't receive a letter, please call our office to schedule the follow-up appointment.

## 2014-12-23 ENCOUNTER — Other Ambulatory Visit: Payer: Medicare Other

## 2014-12-24 ENCOUNTER — Other Ambulatory Visit (INDEPENDENT_AMBULATORY_CARE_PROVIDER_SITE_OTHER): Payer: Medicare Other | Admitting: *Deleted

## 2014-12-24 ENCOUNTER — Ambulatory Visit (HOSPITAL_COMMUNITY)
Admission: RE | Admit: 2014-12-24 | Discharge: 2014-12-24 | Disposition: A | Discharge status: Home / Self Care | Payer: Medicare Other | Source: Ambulatory Visit | Attending: Cardiovascular Disease | Admitting: Cardiovascular Disease

## 2014-12-24 DIAGNOSIS — R079 Chest pain, unspecified: Secondary | ICD-10-CM | POA: Insufficient documentation

## 2014-12-24 DIAGNOSIS — R0609 Other forms of dyspnea: Secondary | ICD-10-CM | POA: Diagnosis not present

## 2014-12-24 DIAGNOSIS — Z8249 Family history of ischemic heart disease and other diseases of the circulatory system: Secondary | ICD-10-CM | POA: Insufficient documentation

## 2014-12-24 DIAGNOSIS — E785 Hyperlipidemia, unspecified: Secondary | ICD-10-CM | POA: Insufficient documentation

## 2014-12-24 DIAGNOSIS — R0789 Other chest pain: Secondary | ICD-10-CM

## 2014-12-24 DIAGNOSIS — R05 Cough: Secondary | ICD-10-CM | POA: Diagnosis not present

## 2014-12-24 DIAGNOSIS — R058 Other specified cough: Secondary | ICD-10-CM

## 2014-12-24 LAB — LIPID PANEL
CHOL/HDL RATIO: 6
CHOLESTEROL: 194 mg/dL (ref 0–200)
HDL: 33.2 mg/dL — AB (ref >39.00)
LDL CALC: 121 mg/dL — AB (ref 0–99)
NonHDL: 160.8
TRIGLYCERIDES: 198 mg/dL — AB (ref 0.0–149.0)
VLDL: 39.6 mg/dL (ref 0.0–40.0)

## 2014-12-24 LAB — BASIC METABOLIC PANEL
BUN: 16 mg/dL (ref 6–23)
CHLORIDE: 106 meq/L (ref 96–112)
CO2: 28 meq/L (ref 19–32)
Calcium: 9.2 mg/dL (ref 8.4–10.5)
Creatinine, Ser: 0.9 mg/dL (ref 0.40–1.50)
GFR: 88.58 mL/min (ref >60.00)
GLUCOSE: 103 mg/dL — AB (ref 70–99)
POTASSIUM: 4.4 meq/L (ref 3.5–5.1)
SODIUM: 140 meq/L (ref 135–145)

## 2014-12-24 LAB — HEPATIC FUNCTION PANEL
ALT: 21 U/L (ref 0–53)
AST: 25 U/L (ref 0–37)
Albumin: 4.2 g/dL (ref 3.5–5.2)
Alkaline Phosphatase: 38 U/L — ABNORMAL LOW (ref 39–117)
BILIRUBIN TOTAL: 0.4 mg/dL (ref 0.2–1.2)
Bilirubin, Direct: 0.1 mg/dL (ref 0.0–0.3)
Total Protein: 6.8 g/dL (ref 6.0–8.3)

## 2014-12-24 MED ORDER — TECHNETIUM TC 99M SESTAMIBI GENERIC - CARDIOLITE
10.8000 | Freq: Once | INTRAVENOUS | Status: AC | PRN
  Administered 2014-12-24: 11 via INTRAVENOUS

## 2014-12-24 MED ORDER — TECHNETIUM TC 99M SESTAMIBI GENERIC - CARDIOLITE
31.5000 | Freq: Once | INTRAVENOUS | Status: AC | PRN
  Administered 2014-12-24: 32 via INTRAVENOUS

## 2014-12-24 NOTE — Procedures (Addendum)
[facility] [address] [place] [address] [phone]  Cardiology Nuclear Med Study  Mark Garner is a 70 y.o. male     MRN : [id number]     DOB: [birth year]  Procedure Date: 12/24/2014  Nuclear Med Background Indication for Stress Test:  Evaluation for Ischemia History:  No prior cardiac or respiratory history reported;No prior NUC MPI for comparison. Cardiac Risk Factors: Family History - CAD and Lipids  Symptoms:  Chest Pain and DOE   Nuclear Pre-Procedure Caffeine/Decaff Intake:  9:00pm NPO After: 7:00am   IV Site: R Forearm  IV 0.9% NS with Angio Cath:  22g  Chest Size (in):  44" IV Started by: Rolene Course, RN  Height: 5\' 9"  (1.753 m)  Cup Size: n/a  BMI:  Body mass index is 25.24 kg/(m^2). Weight:  171 lb (77.565 kg)   Tech Comments:  n/a    Nuclear Med Study 1 or 2 day study: 1 day  Stress Test Type:  Stress  Order Authorizing Provider:  Cleatrice Burke, MD   Resting Radionuclide: Technetium 61m Sestamibi  Resting Radionuclide Dose: 10.8 mCi   Stress Radionuclide:  Technetium 88m Sestamibi  Stress Radionuclide Dose: 31.5 mCi           Stress Protocol Rest HR: 60 Stress HR: 153  Rest BP: 151/95 Stress BP: 213/84  Exercise Time (min): 6:31 METS: 7.80   Predicted Max HR: 150 bpm % Max HR: 102 bpm Rate Pressure Product: 32589  Dose of Adenosine (mg):  n/a Dose of Lexiscan: n/a mg  Dose of Atropine (mg): n/a Dose of Dobutamine: n/a mcg/kg/min (at max HR)  Stress Test Technologist: Mellody Memos, CCT Nuclear Technologist: Imagene Riches, CNMT   Rest Procedure:  Myocardial perfusion imaging was performed at rest 45 minutes following the intravenous administration of Technetium 36m Sestamibi. Stress Procedure:  The patient performed treadmill exercise using a Bruce  Protocol for 6 minutes 31 seconds. The patient stopped due to shortness of breath. Patient denied any chest pain.  There were  significant  ST-T wave changes.  Technetium 46m Sestamibi was injected at peak exercise and myocardial perfusion imaging was performed after a brief delay.  Transient Ischemic Dilatation (Normal <1.22):  0.75  QGS EDV:  82 ml QGS ESV:  35 ml LV Ejection Fraction: 58%       Rest ECG: NSR-RBBB  Stress ECG: No significant change from baseline ECG  QPS Raw Data Images:  Normal; no motion artifact; normal heart/lung ratio. Stress Images:  Normal homogeneous uptake in all areas of the myocardium. Rest Images:  Normal homogeneous uptake in all areas of the myocardium. Subtraction (SDS):  No evidence of ischemia.  Impression Exercise Capacity:  Good exercise capacity. BP Response:  Normal blood pressure response. Clinical Symptoms:  No significant symptoms noted. ECG Impression:  No significant ST segment change suggestive of ischemia. Comparison with Prior Nuclear Study: No previous nuclear study performed  Overall Impression:  Normal stress nuclear study.  LV Wall Motion:  NL LV Function; NL Wall Motion   Lorretta Harp, MD  12/24/2014 1:17 PM

## 2014-12-24 NOTE — Addendum Note (Signed)
Addended by: Eulis Foster on: 12/24/2014 07:54 AM   Modules accepted: Orders

## 2016-09-15 ENCOUNTER — Ambulatory Visit: Payer: Medicare Other | Admitting: Neurology

## 2016-10-19 ENCOUNTER — Encounter: Payer: Self-pay | Admitting: Neurology

## 2016-10-19 ENCOUNTER — Ambulatory Visit (INDEPENDENT_AMBULATORY_CARE_PROVIDER_SITE_OTHER): Payer: Medicare Other | Admitting: Neurology

## 2016-10-19 VITALS — BP 150/73 | HR 65 | Resp 16 | Ht 69.0 in | Wt 171.0 lb

## 2016-10-19 DIAGNOSIS — Z9889 Other specified postprocedural states: Secondary | ICD-10-CM | POA: Diagnosis not present

## 2016-10-19 DIAGNOSIS — R208 Other disturbances of skin sensation: Secondary | ICD-10-CM | POA: Diagnosis not present

## 2016-10-19 DIAGNOSIS — G629 Polyneuropathy, unspecified: Secondary | ICD-10-CM

## 2016-10-19 HISTORY — DX: Other specified postprocedural states: Z98.890

## 2016-10-19 HISTORY — DX: Other disturbances of skin sensation: R20.8

## 2016-10-19 HISTORY — DX: Polyneuropathy, unspecified: G62.9

## 2016-10-19 MED ORDER — LAMOTRIGINE 100 MG PO TABS: 100.0000 mg | ORAL_TABLET | Freq: Two times a day (BID) | ORAL | 5 refills | Status: DC

## 2016-10-19 NOTE — Patient Instructions (Signed)
The pharmacy has the prescription for lamotrigine 100 mg tablets. For 5 days, just take one half pill a day. For the next 5 days, take one half pill twice a day. For the next 5 days, take one half pill 3 times a day Then start taking one pill twice a day from this point on.    In the future, we may increase the dose further.  If you get a rash, you need to stop the medication and not take it again. 

## 2016-10-19 NOTE — Progress Notes (Signed)
[facility]  PATIENT: Mark Garner DOB: 04/06/1946  REFERRING DOCTOR OR PCP:  Dr. Arnoldo Morale and Dr. Brien Few ([facility]), PCP is Dr. Lauretta Grill [name]) SOURCE: Patient, notes from Dr. Arnoldo Morale, NCV study from Dr. Brien Few, lab results, MRI results and images.  _________________________________   HISTORICAL  CHIEF COMPLAINT:  Chief Complaint  Patient presents with  . Numbness    Mark Garner is here for eval of tingling lower legs and feet, onset about a yr. ago.  Hx. of surgery on c-spine (2002, Dr. Arnoldo Morale at [facility]), and surgery on lumbar spine (2011--Dr. Arnoldo Morale at [facility]).  Sts. he had an EMG at [facility] 1-2 mos. ago and was told tingling is due to neuropathy.  Has not tried any medications/fim    HISTORY OF PRESENT ILLNESS:  I had the pleasure seeing you patient, Mark Garner, at [facility] for neurologic consultation regarding his polyneuropathy and numbness/tingling in the feet and lower legs.  Mark Garner is in general good health. Rides his bicycle frequently for long trips may get some numbness or burning in his feet any years. However, starting about 1 year ago, he began to notice more numbness in the feet and then some numbness in the ankle and lower legs. No numbness above the knees. There is no numbness in the hands or face.  He has not noticed any weakness. He has noted that sometimes when walking barefoot he gets a significant dysesthetic sensation in the feet. The rest of the time he has a milder tingling that can be uncomfortable more than painful.      He denies difficulty with gait or balance. He rides his bicycle less than he used to but still about 100 miles every week. He has nocturia but this has been a stable pattern. He does not have difficulty walking in dim light.  He had L4L5 lumbar surgery 2011 and C6-C7 cervical fusion 2002, both with Dr. Arnoldo Morale.      A recent nerve conduction study/EMG consistent with mild polyneuropathy with absent cervical  sensory responses and neuropathic changes in the intrinsic foot muscles.   He has not tried any medication for the dysesthesia. In the past, he was on gabapentin for radicular pain and noted that it made him sleepy.   He stopped after a short time.  I reviewed notes from his neurosurgeon (Dr. Arnoldo Morale) and a nerve conduction study performed by Dr. Brien Few 08/12/2016.   The nerve conduction study showed absent sural sensory responses, tibial motor slowing in the arms were normal. EMG showed chronic neuropathic changes in the intrinsic foot muscles.  I also reviewed the MRI of the lumbar spine dated 09/10/2009.   There are degenerative changes at L3-L4, L4-L5 and L5-S1. At L4-L5, there is right foraminal narrowing but there does not appear to be nerve root compression. At L5-S1, there is moderate left foraminal narrowing encroaching upon the left L5 nerve root without causing definite compression.  REVIEW OF SYSTEMS: Constitutional: No fevers, chills, sweats, or change in appetite Eyes: No visual changes, double vision, eye pain Ear, nose and throat: No hearing loss, ear pain, nasal congestion, sore throat Cardiovascular: No chest pain, palpitations Respiratory: No shortness of breath at rest or with exertion.   No wheezes GastrointestinaI: No nausea, vomiting, diarrhea, abdominal pain, fecal incontinence.    Occ GERD  Genitourinary: No dysuria, urinary retention or frequency.  No nocturia. Musculoskeletal: No neck pain, back pain Integumentary: No rash, pruritus, skin lesions Neurological: as above Psychiatric: No depression at this  time.  No anxiety Endocrine: No palpitations, diaphoresis, change in appetite, change in weigh or increased thirst Hematologic/Lymphatic: No anemia, purpura, petechiae. Allergic/Immunologic: No itchy/runny eyes, nasal congestion, recent allergic reactions, rashes  ALLERGIES: Allergies  Allergen Reactions  . Fenofibrate Other (See Comments)    Leg pain  .  Gemfibrozil Other (See Comments)    Leg pain  . Niacin And Related     Severe flushing    HOME MEDICATIONS:  Current Outpatient Prescriptions:  .  acetaminophen (TYLENOL) 500 MG tablet, Take 500 mg by mouth every 6 (six) hours as needed.  , Disp: , Rfl:  .  Omega-3 Fatty Acids (FISH OIL) 1000 MG CAPS, Take 4 capsules (4,000 mg total) by mouth daily., Disp: , Rfl: 0 .  Simethicone (GAS-X EXTRA STRENGTH PO), Take 1 capsule by mouth as needed.  , Disp: , Rfl:   PAST MEDICAL HISTORY: Past Medical History:  Diagnosis Date  . Chest pain   . Heart murmur   . Hyperlipidemia   . Indigestion   . Normal echocardiogram 05/31/05   normal LV function; trace MR;trace tricuspid regurgitation  . Normal nuclear stress test 07/26/02   no evidence of ischemia; nml LVF  . Right bundle branch block   . Trace mitral regurgitation by prior echocardiogram   . Trace tricuspid regurgitation by prior echocardiogram     PAST SURGICAL HISTORY: Past Surgical History:  Procedure Laterality Date  . SPINE SURGERY     x 2     FAMILY HISTORY: Family History  Problem Relation Age of Onset  . Heart disease Brother 46    MI/ptca    SOCIAL HISTORY:  Social History   Social History  . Marital status: Married    Spouse name: N/A  . Number of children: N/A  . Years of education: N/A   Occupational History  . Retired    Social History Main Topics  . Smoking status: Former Smoker    Packs/day: 1.00    Years: 5.00    Types: Cigarettes    Quit date: 10/04/1984  . Smokeless tobacco: Never Used  . Alcohol use No  . Drug use: No  . Sexual activity: Not on file   Other Topics Concern  . Not on file   Social History Narrative  . No narrative on file     PHYSICAL EXAM  Vitals:   10/19/16 1008  BP: (!) 150/73  Pulse: 65  Resp: 16  Weight: 171 lb (77.6 kg)  Height: 5' 9"  (1.753 m)    Body mass index is 25.25 kg/m.   General: The patient is well-developed and well-nourished and in no  acute distress  Neck: The neck is supple, no carotid bruits are noted.  The neck is nontender.  Cardiovascular: The heart has a regular rate and rhythm with a normal S1 and S2. There were no murmurs, gallops or rubs.    Skin: Extremities are without significant edema.  Musculoskeletal:  Back is nontender  Neurologic Exam  Mental status: The patient is alert and oriented x 3 at the time of the examination. The patient has apparent normal recent and remote memory, with an apparently normal attention span and concentration ability.   Speech is normal.  Cranial nerves: Extraocular movements are full. Pupils are equal, round, and reactive to light and accomodation.  Visual fields are full.  Facial symmetry is present. There is good facial sensation to soft touch bilaterally.Facial strength is normal.  Trapezius and sternocleidomastoid strength is normal. No  dysarthria is noted.  The tongue is midline, and the patient has symmetric elevation of the soft palate. No obvious hearing deficits are noted.  Motor:  Muscle bulk is normal.   Tone is normal. Strength is  5 / 5 in all 4 extremities.   Sensory: Sensory testing is intact to pinprick, soft touch and vibration sensation in the arms/hands.  Relative to the knee, vibratory sensation was 50% at the right ankle and 90% at the left ankle 10% at the right great toe, 25% at the right digit and a 25% at the left great toe.   Touch sensation was normal in the feet and legs but temperature sensation was reduced below the ankles  Coordination: Cerebellar testing reveals good finger-nose-finger and heel-to-shin bilaterally.  Gait and station: Station is normal.   Gait is normal. Tandem gait is slightly wide.  Romberg is negative.   Reflexes: Deep tendon reflexes are symmetric and normal bilaterally in the arms.    DTRs are 3+ at the knees and trace at the ankles.   Plantar responses are flexor.    DIAGNOSTIC DATA (LABS, IMAGING, TESTING) - I reviewed  patient records, labs, notes, testing and imaging myself where available.  Lab Results  Component Value Date   WBC 5.6 02/25/2010   HGB 13.8 02/25/2010   HCT 39.8 02/25/2010   MCV 90.4 02/25/2010   PLT 221 02/25/2010      Component Value Date/Time   NA 140 12/24/2014 0754   K 4.4 12/24/2014 0754   CL 106 12/24/2014 0754   CO2 28 12/24/2014 0754   GLUCOSE 103 (H) 12/24/2014 0754   BUN 16 12/24/2014 0754   CREATININE 0.90 12/24/2014 0754   CALCIUM 9.2 12/24/2014 0754   PROT 6.8 12/24/2014 0754   ALBUMIN 4.2 12/24/2014 0754   AST 25 12/24/2014 0754   ALT 21 12/24/2014 0754   ALKPHOS 38 (L) 12/24/2014 0754   BILITOT 0.4 12/24/2014 0754   GFRNONAA >60 02/25/2010 1122   GFRAA  02/25/2010 1122    >60        The eGFR has been calculated using the MDRD equation. This calculation has not been validated in all clinical situations. eGFR's persistently <60 mL/min signify possible Chronic Kidney Disease.   Lab Results  Component Value Date   CHOL 194 12/24/2014   HDL 33.20 (L) 12/24/2014   LDLCALC 121 (H) 12/24/2014   LDLDIRECT 80.7 06/14/2014   TRIG 198.0 (H) 12/24/2014   CHOLHDL 6 12/24/2014      ASSESSMENT AND PLAN  Polyneuropathy ([place]) - Plan: Sedimentation rate, ANA w/Reflex, Multiple Myeloma Panel (SPEP&IFE w/QIG), Vitamin B12  Dysesthesia - Plan: MR CERVICAL SPINE WO CONTRAST  History of cervical spinal surgery - Plan: MR CERVICAL SPINE WO CONTRAST  History of lumbosacral spine surgery   In summary, Mark Garner is a 71 year old man who has noted progressive numbness with dysesthesias and lower legs over the past year.   His examination confirms reduced sensation in the feet, especially to vibratory sensation with normal strength. Deep tendon reflexes were actually increased at the knees but decreased at the ankles.   We will check some blood work to rule out treatable causes of polyneuropathy including ESR, ANA, B12, SPEP/IFE.  Additionally, as reflexes were  increased at the knees and he has a history of spinal surgery of genital changes above and below the fusion, we will check an MRI of the cervical spine to rule out myelopathy or spinal stenosis.  Further evaluation and treatment will  be made depending on the results of the blood work and MRI. For the dysesthesias, lamotrigine was started and will be titrated up to 100 mg twice a day.  He will return in 2-3 months or sooner if there are new or worsening neurologic symptoms.  Thank you for asking me to see Mark Garner for a neurologic consultation. Please let me know if I can be of further assistance with her or other patients in the future.   Mark Garner A. Felecia Shelling, MD, PhD 4/38/8875, 79:72 AM Certified in Neurology, Clinical Neurophysiology, Sleep Medicine, Pain Medicine and Neuroimaging  [facility] [address] [place], [place] [address] [phone]

## 2016-10-21 LAB — MULTIPLE MYELOMA PANEL, SERUM
Albumin SerPl Elph-Mcnc: 3.8 g/dL (ref 2.9–4.4)
Albumin/Glob SerPl: 1.3 (ref 0.7–1.7)
Alpha 1: 0.2 g/dL (ref 0.0–0.4)
Alpha2 Glob SerPl Elph-Mcnc: 0.7 g/dL (ref 0.4–1.0)
B-Globulin SerPl Elph-Mcnc: 1.1 g/dL (ref 0.7–1.3)
Gamma Glob SerPl Elph-Mcnc: 1 g/dL (ref 0.4–1.8)
Globulin, Total: 3 g/dL (ref 2.2–3.9)
IGA/IMMUNOGLOBULIN A, SERUM: 176 mg/dL (ref 61–437)
IGM (IMMUNOGLOBULIN M), SRM: 145 mg/dL — AB (ref 15–143)
IgG (Immunoglobin G), Serum: 901 mg/dL (ref 700–1600)
TOTAL PROTEIN: 6.8 g/dL (ref 6.0–8.5)

## 2016-10-21 LAB — SEDIMENTATION RATE: Sed Rate: 4 mm/hr (ref 0–30)

## 2016-10-21 LAB — ANA W/REFLEX: Anti Nuclear Antibody(ANA): NEGATIVE

## 2016-10-21 LAB — VITAMIN B12: VITAMIN B 12: 1016 pg/mL (ref 232–1245)

## 2016-10-22 ENCOUNTER — Telehealth: Payer: Self-pay | Admitting: *Deleted

## 2016-10-22 NOTE — Telephone Encounter (Signed)
-----   Message from Britt Bottom, MD sent at 10/21/2016  5:04 PM EST ----- Please let him know the labwork is fine

## 2016-10-22 NOTE — Telephone Encounter (Signed)
I have spoken with pt. this am and per RAS, advised labs done in our office looked good.  He verbalized understanding of same/fim

## 2016-10-25 ENCOUNTER — Ambulatory Visit
Admission: RE | Admit: 2016-10-25 | Discharge: 2016-10-25 | Disposition: A | Discharge status: Home / Self Care | Payer: Medicare Other | Source: Ambulatory Visit | Attending: Neurology | Admitting: Neurology

## 2016-10-25 DIAGNOSIS — R208 Other disturbances of skin sensation: Secondary | ICD-10-CM | POA: Diagnosis not present

## 2016-10-25 DIAGNOSIS — Z9889 Other specified postprocedural states: Secondary | ICD-10-CM

## 2016-10-27 ENCOUNTER — Telehealth: Payer: Self-pay | Admitting: *Deleted

## 2016-10-27 NOTE — Telephone Encounter (Signed)
-----   Message from Britt Bottom, MD sent at 10/25/2016  7:29 PM EST ----- Please let him know that the MRI of the cervical spine shows degenerative/arthritic changes and his prior cervical fusion. However, there is no pressure on the spinal cord or any nerve root.

## 2016-10-27 NOTE — Telephone Encounter (Signed)
LMOM (identified vm) with MRI results as below.  He does not need tor return this call unless he has questions/fim

## 2017-01-11 ENCOUNTER — Encounter: Payer: Self-pay | Admitting: Neurology

## 2017-01-11 ENCOUNTER — Ambulatory Visit (INDEPENDENT_AMBULATORY_CARE_PROVIDER_SITE_OTHER): Payer: Medicare Other | Admitting: Neurology

## 2017-01-11 ENCOUNTER — Encounter (INDEPENDENT_AMBULATORY_CARE_PROVIDER_SITE_OTHER): Payer: Self-pay

## 2017-01-11 VITALS — BP 126/58 | HR 64 | Resp 16 | Ht 69.0 in | Wt 170.5 lb

## 2017-01-11 DIAGNOSIS — R208 Other disturbances of skin sensation: Secondary | ICD-10-CM | POA: Diagnosis not present

## 2017-01-11 DIAGNOSIS — G629 Polyneuropathy, unspecified: Secondary | ICD-10-CM | POA: Diagnosis not present

## 2017-01-11 MED ORDER — LAMOTRIGINE 100 MG PO TABS: 100.0000 mg | ORAL_TABLET | Freq: Two times a day (BID) | ORAL | 12 refills | Status: DC

## 2017-01-11 NOTE — Patient Instructions (Signed)
Alpha Lipoic Acid    400-600 mg twice a day

## 2017-01-11 NOTE — Progress Notes (Signed)
[facility]  PATIENT: Mark Garner DOB: [birth year]  REFERRING DOCTOR OR PCP:  Dr. Arnoldo Morale and Dr. Brien Few ([facility]), PCP is Dr. Lauretta Grill [name]) SOURCE: Patient, notes from Dr. Arnoldo Morale, NCV study from Dr. Brien Few, lab results, MRI results and images.  _________________________________   HISTORICAL  CHIEF COMPLAINT:  Chief Complaint  Patient presents with  . Polyneuropathy    Sts. numbness/tingling lower legs and feet some improved with Lamictal 143m bid./fim    HISTORY OF PRESENT ILLNESS:   Mark Garner a 71yo man with polyneuropathy and numbness/tingling in the feet and lower legs.  At the last visit, lamotrigine was added and titrated to 100 mg po bid.     His pain has improved about 25-35%.   He still has tingling in the feet but the pain in his toes is better.      There is no numbness above the knees. There is no numbness in the hands or face.  He has not noticed any weakness.  He has felt slightly dizzy but not dramatically.   He feels very slightly more clumsy and has dropped a few items.    Neuropathy history:   In early 2017,, he began to notice more numbness in the feet and then some numbness in the ankle and lower legs. He denies difficulty with gait or balance. He rides his bicycle less than he used to but still about 100 miles every week. He has nocturia but this has been a stable pattern. He does not have difficulty walking in dim light.  He had L4L5 lumbar surgery 2011 and C6-C7 cervical fusion 2002, both with Dr. JArnoldo Morale   In the past, he was on gabapentin for radicular pain and noted that it made him sleepy.   He stopped after a short time.  Data:   Nerve conduction study performed by Dr. [name]11/06/2016.   The nerve conduction study showed absent sural sensory responses, tibial motor slowing in the arms were normal. EMG showed chronic neuropathic changes in the intrinsic foot muscles.  I also reviewed the MRI of the lumbar spine dated 09/10/2009.    There are degenerative changes at L3-L4, L4-L5 and L5-S1. At L4-L5, there is right foraminal narrowing but there does not appear to be nerve root compression. At L5-S1, there is moderate left foraminal narrowing encroaching upon the left L5 nerve root without causing definite compression.   10/2016:  SPEP/IFE, ANA, ESR, B12 were normal or negative.  REVIEW OF SYSTEMS: Constitutional: No fevers, chills, sweats, or change in appetite Eyes: No visual changes, double vision, eye pain Ear, nose and throat: No hearing loss, ear pain, nasal congestion, sore throat Cardiovascular: No chest pain, palpitations Respiratory: No shortness of breath at rest or with exertion.   No wheezes GastrointestinaI: No nausea, vomiting, diarrhea, abdominal pain, fecal incontinence.    Occ GERD  Genitourinary: No dysuria, urinary retention or frequency.  No nocturia. Musculoskeletal: No neck pain, back pain Integumentary: No rash, pruritus, skin lesions Neurological: as above Psychiatric: No depression at this time.  No anxiety Endocrine: No palpitations, diaphoresis, change in appetite, change in weigh or increased thirst Hematologic/Lymphatic: No anemia, purpura, petechiae. Allergic/Immunologic: No itchy/runny eyes, nasal congestion, recent allergic reactions, rashes  ALLERGIES: Allergies  Allergen Reactions  . Fenofibrate Other (See Comments)    Leg pain  . Gemfibrozil Other (See Comments)    Leg pain  . Niacin And Related     Severe flushing    HOME MEDICATIONS:  Current  Outpatient Prescriptions:  .  acetaminophen (TYLENOL) 500 MG tablet, Take 500 mg by mouth every 6 (six) hours as needed.  , Disp: , Rfl:  .  lamoTRIgine (LAMICTAL) 100 MG tablet, Take 1 tablet (100 mg total) by mouth 2 (two) times daily., Disp: 60 tablet, Rfl: 12 .  Omega-3 Fatty Acids (FISH OIL) 1000 MG CAPS, Take 4 capsules (4,000 mg total) by mouth daily., Disp: , Rfl: 0 .  Simethicone (GAS-X EXTRA STRENGTH PO), Take 1 capsule  by mouth as needed.  , Disp: , Rfl:   PAST MEDICAL HISTORY: Past Medical History:  Diagnosis Date  . Chest pain   . Heart murmur   . Hyperlipidemia   . Indigestion   . Normal echocardiogram 05/31/05   normal LV function; trace MR;trace tricuspid regurgitation  . Normal nuclear stress test 07/26/02   no evidence of ischemia; nml LVF  . Right bundle branch block   . Trace mitral regurgitation by prior echocardiogram   . Trace tricuspid regurgitation by prior echocardiogram     PAST SURGICAL HISTORY: Past Surgical History:  Procedure Laterality Date  . SPINE SURGERY     x 2     FAMILY HISTORY: Family History  Problem Relation Age of Onset  . Heart disease Brother 52    MI/ptca    SOCIAL HISTORY:  Social History   Social History  . Marital status: Married    Spouse name: N/A  . Number of children: N/A  . Years of education: N/A   Occupational History  . Retired    Social History Main Topics  . Smoking status: Former Smoker    Packs/day: 1.00    Years: 5.00    Types: Cigarettes    Quit date: 10/04/1984  . Smokeless tobacco: Never Used  . Alcohol use No  . Drug use: No  . Sexual activity: Not on file   Other Topics Concern  . Not on file   Social History Narrative  . No narrative on file     PHYSICAL EXAM  Vitals:   01/11/17 1110  BP: (!) 126/58  Pulse: 64  Resp: 16  Weight: 170 lb 8 oz (77.3 kg)  Height: 5' 9"  (1.753 m)    Body mass index is 25.18 kg/m.   General: The patient is well-developed and well-nourished and in no acute distress   Neurologic Exam  Mental status: The patient is alert and oriented x 3 at the time of the examination. The patient has apparent normal recent and remote memory, with an apparently normal attention span and concentration ability.   Speech is normal.  Cranial nerves: Extraocular movements are full. Facial strength and sensation are normal. Trapezius and sternocleidomastoid strength is normal. No dysarthria  is noted.  The tongue is midline, and the patient has symmetric elevation of the soft palate. No obvious hearing deficits are noted.  Motor:  Muscle bulk is normal.   Tone is normal. Strength is  5 / 5 in all 4 extremities.   Sensory: Sensory testing is intact to pinprick, soft touch and vibration sensation in the arms/hands.  Relative to the knee, vibratory sensation was 60% at the right ankle and  30% at the right great toe   Touch sensation was normal in the feet and legs but temperature sensation was reduced below the ankles  Coordination: Cerebellar testing reveals good finger-nose-finger and heel-to-shin bilaterally.  Gait and station: Station is normal.   Gait is normal. Tandem gait is slightly wide.  Romberg  is negative.   Reflexes: Deep tendon reflexes are symmetric and normal bilaterally in the arms.    DTRs are 2+ at the knees and trace at the ankles.        DIAGNOSTIC DATA (LABS, IMAGING, TESTING) - I reviewed patient records, labs, notes, testing and imaging myself where available.  Lab Results  Component Value Date   WBC 5.6 02/25/2010   HGB 13.8 02/25/2010   HCT 39.8 02/25/2010   MCV 90.4 02/25/2010   PLT 221 02/25/2010      Component Value Date/Time   NA 140 12/24/2014 0754   K 4.4 12/24/2014 0754   CL 106 12/24/2014 0754   CO2 28 12/24/2014 0754   GLUCOSE 103 (H) 12/24/2014 0754   BUN 16 12/24/2014 0754   CREATININE 0.90 12/24/2014 0754   CALCIUM 9.2 12/24/2014 0754   PROT 6.8 10/19/2016 1144   ALBUMIN 4.2 12/24/2014 0754   AST 25 12/24/2014 0754   ALT 21 12/24/2014 0754   ALKPHOS 38 (L) 12/24/2014 0754   BILITOT 0.4 12/24/2014 0754   GFRNONAA >60 02/25/2010 1122   GFRAA  02/25/2010 1122    >60        The eGFR has been calculated using the MDRD equation. This calculation has not been validated in all clinical situations. eGFR's persistently <60 mL/min signify possible Chronic Kidney Disease.   Lab Results  Component Value Date   CHOL 194  12/24/2014   HDL 33.20 (L) 12/24/2014   LDLCALC 121 (H) 12/24/2014   LDLDIRECT 80.7 06/14/2014   TRIG 198.0 (H) 12/24/2014   CHOLHDL 6 12/24/2014      ASSESSMENT AND PLAN  Polyneuropathy ([facility])  Dysesthesia  1.  Continue lamotrigine 100 mg by mouth twice a day. 2.   Add alpha lipoic acid 600 mg by mouth 3.    rtc 1 year or sooner if problems   Mark Cassar A. Felecia Shelling, MD, PhD 2/35/3614, 43:15 PM Certified in Neurology, Clinical Neurophysiology, Sleep Medicine, Pain Medicine and Neuroimaging  [facility] [address] [place], [place] [address] [phone]

## 2017-02-24 ENCOUNTER — Telehealth: Payer: Self-pay | Admitting: Neurology

## 2017-02-24 NOTE — Telephone Encounter (Signed)
LMTC./fim 

## 2017-02-24 NOTE — Telephone Encounter (Signed)
Pt is wanting to taper off lamoTRIgine (LAMICTAL) 100 MG tablet and maybe try gabapentin. He said it is not helping. Please call

## 2017-03-01 NOTE — Telephone Encounter (Signed)
I have spoken with Mr. Ibe this afternoon.  At last ov, he stated Lamictal was helping his sx.  He now sts. he thinks he may back off of Lamictal to see if sx. worsen, then titrate back up on it.  I offered appt. with RAS to discuss; pt. sts. will continue Lamictal as is rx'd for now and if he decides he wants to decrease or change meds, he will call back for appt. Hilton Cork

## 2017-06-03 ENCOUNTER — Other Ambulatory Visit: Payer: Self-pay | Admitting: Neurology

## 2017-10-26 ENCOUNTER — Telehealth: Payer: Self-pay | Admitting: Neurology

## 2017-10-26 NOTE — Telephone Encounter (Signed)
Pt is wanting to taper off lamoTRIgine (LAMICTAL) 100 MG tablet. He is wanting to start gabapentin, he has taken it in the past and feels the side effects would be less than that of the lamotrigine. Please call to advise

## 2017-10-27 NOTE — Telephone Encounter (Signed)
Per RN pt needs an appt to discuss medication change. I called him this morning to let him know of an availablity on 1/28 to discuss the medication change. Pt said he did not need an appt, he just wanted to speak with RN and to have her call him. I advised him she said he would need to be seen but he said to have RN call me. Please call to advise

## 2017-10-27 NOTE — Telephone Encounter (Signed)
LMOM that RAS is happy to discuss a med change with him, but as he has not been seen since April 2018, this is best done w/i an office visit.  Appt. has been offered for next week.  Please call if he would like to take that appt/fim

## 2017-10-31 ENCOUNTER — Ambulatory Visit: Payer: Medicare Other | Admitting: Neurology

## 2017-10-31 ENCOUNTER — Encounter: Payer: Self-pay | Admitting: Neurology

## 2017-10-31 VITALS — BP 136/73 | HR 57 | Ht 69.0 in | Wt 167.5 lb

## 2017-10-31 DIAGNOSIS — R208 Other disturbances of skin sensation: Secondary | ICD-10-CM

## 2017-10-31 DIAGNOSIS — G629 Polyneuropathy, unspecified: Secondary | ICD-10-CM | POA: Diagnosis not present

## 2017-10-31 DIAGNOSIS — Z9889 Other specified postprocedural states: Secondary | ICD-10-CM | POA: Diagnosis not present

## 2017-10-31 MED ORDER — LAMOTRIGINE 100 MG PO TABS: ORAL_TABLET | ORAL | 12 refills | Status: DC

## 2017-10-31 NOTE — Patient Instructions (Signed)
Try taking alpha lipoic acid 600 mg twice a day.   You can get supplements at drugstores or online.

## 2017-10-31 NOTE — Progress Notes (Signed)
[facility]  PATIENT: Mark Garner DOB: [birth year]  REFERRING DOCTOR OR PCP:  Dr. Arnoldo Morale and Dr. Brien Few ([facility]), PCP is Dr. Lauretta Grill [name]) SOURCE: Patient, notes from Dr. Arnoldo Morale, NCV study from Dr. Brien Few, lab results, MRI results and images.  _________________________________   HISTORICAL  CHIEF COMPLAINT:  Chief Complaint  Patient presents with  . Follow-up    Patient states that the Lamictal makes him have no energy or motivation to do anything.     HISTORY OF PRESENT ILLNESS:   Mark Garner is a 72 yo man with polyneuropathy and numbness/tingling in the feet and lower legs.  Update 10/31/2017: He has had neuropathic pain in his feet since 2017. A nerve conduction/EMG  study that year showed changes consistent with neuropathy.   MRI also showed around 10 narrowing, worse on the left at L5-S1 but no definite nerve root compression. I saw him shortly after the NCV/EMG and check SPEP/IFE, and other blood work and they were normal or negative.   He has been on lamotrigine since January 2018. Pain did improve and he is noting that he feels much less energetic and less motivated. He would like to switch to different medicines.    He notes ting;ing in his feet up towards the knee but not higher.    Being active helps by distraction so he notes it most at night.     He notes mild bilateral hearing loss.  Of note his father, uncle and both brothers have hearing issues.    He also notes reduced taste and smell.      From 01/11/2017: At the last visit, lamotrigine was added and titrated to 100 mg po bid.     His pain has improved about 25-35%.   He still has tingling in the feet but the pain in his toes is better.      There is no numbness above the knees. There is no numbness in the hands or face.  He has not noticed any weakness.  He has felt slightly dizzy but not dramatically.   He feels very slightly more clumsy and has dropped a few items.    Neuropathy history:    In early 2017,, he began to notice more numbness in the feet and then some numbness in the ankle and lower legs. He denies difficulty with gait or balance. He rides his bicycle less than he used to but still about 100 miles every week. He has nocturia but this has been a stable pattern. He does not have difficulty walking in dim light.  He had L4L5 lumbar surgery 2011 and C6-C7 cervical fusion 2002, both with Dr. Arnoldo Morale.   In the past, he was on gabapentin for radicular pain and noted that it made him sleepy.   He stopped after a short time.  Data:   Nerve conduction study performed by Dr. Brien Few 08/12/2016.   The nerve conduction study showed absent sural sensory responses, tibial motor slowing in the arms were normal. EMG showed chronic neuropathic changes in the intrinsic foot muscles.  I also reviewed the MRI of the lumbar spine dated 09/10/2009.   There are degenerative changes at L3-L4, L4-L5 and L5-S1. At L4-L5, there is right foraminal narrowing but there does not appear to be nerve root compression. At L5-S1, there is moderate left foraminal narrowing encroaching upon the left L5 nerve root without causing definite compression.   10/2016:  SPEP/IFE, ANA, ESR, B12 were normal or negative.  REVIEW OF SYSTEMS:  Constitutional: No fevers, chills, sweats, or change in appetite Eyes: No visual changes, double vision, eye pain Ear, nose and throat: No hearing loss, ear pain, nasal congestion, sore throat Cardiovascular: No chest pain, palpitations Respiratory: No shortness of breath at rest or with exertion.   No wheezes GastrointestinaI: No nausea, vomiting, diarrhea, abdominal pain, fecal incontinence.    Occ GERD  Genitourinary: No dysuria, urinary retention or frequency.  No nocturia. Musculoskeletal: No neck pain, back pain Integumentary: No rash, pruritus, skin lesions Neurological: as above Psychiatric: No depression at this time.  No anxiety Endocrine: No palpitations, diaphoresis,  change in appetite, change in weigh or increased thirst Hematologic/Lymphatic: No anemia, purpura, petechiae. Allergic/Immunologic: No itchy/runny eyes, nasal congestion, recent allergic reactions, rashes  ALLERGIES: Allergies  Allergen Reactions  . Fenofibrate Other (See Comments)    Leg pain  . Gemfibrozil Other (See Comments)    Leg pain  . Niacin And Related     Severe flushing    HOME MEDICATIONS:  Current Outpatient Medications:  .  acetaminophen (TYLENOL) 500 MG tablet, Take 500 mg by mouth every 6 (six) hours as needed.  , Disp: , Rfl:  .  lamoTRIgine (LAMICTAL) 100 MG tablet, Take one po in the am and two po qHS, Disp: 90 tablet, Rfl: 12 .  Omega-3 Fatty Acids (FISH OIL) 1000 MG CAPS, Take 4 capsules (4,000 mg total) by mouth daily., Disp: , Rfl: 0 .  Simethicone (GAS-X EXTRA STRENGTH PO), Take 1 capsule by mouth as needed.  , Disp: , Rfl:   PAST MEDICAL HISTORY: Past Medical History:  Diagnosis Date  . Chest pain   . Heart murmur   . Hyperlipidemia   . Indigestion   . Normal echocardiogram 05/31/05   normal LV function; trace MR;trace tricuspid regurgitation  . Normal nuclear stress test 07/26/02   no evidence of ischemia; nml LVF  . Right bundle branch block   . Trace mitral regurgitation by prior echocardiogram   . Trace tricuspid regurgitation by prior echocardiogram     PAST SURGICAL HISTORY: Past Surgical History:  Procedure Laterality Date  . SPINE SURGERY     x 2     FAMILY HISTORY: Family History  Problem Relation Age of Onset  . Heart disease Brother 41       MI/ptca    SOCIAL HISTORY:  Social History   Socioeconomic History  . Marital status: Married    Spouse name: Not on file  . Number of children: Not on file  . Years of education: Not on file  . Highest education level: Not on file  Social Needs  . Financial resource strain: Not on file  . Food insecurity - worry: Not on file  . Food insecurity - inability: Not on file  .  Transportation needs - medical: Not on file  . Transportation needs - non-medical: Not on file  Occupational History  . Occupation: Retired  Tobacco Use  . Smoking status: Former Smoker    Packs/day: 1.00    Years: 5.00    Pack years: 5.00    Types: Cigarettes    Last attempt to quit: 10/04/1984    Years since quitting: 33.0  . Smokeless tobacco: Never Used  Substance and Sexual Activity  . Alcohol use: No    Alcohol/week: 0.0 oz  . Drug use: No  . Sexual activity: Not on file  Other Topics Concern  . Not on file  Social History Narrative  . Not on file  PHYSICAL EXAM  Vitals:   10/31/17 1114  BP: 136/73  Pulse: (!) 57  Weight: 167 lb 8 oz (76 kg)  Height: 5' 9" (1.753 m)    Body mass index is 24.74 kg/m.   General: The patient is well-developed and well-nourished and in no acute distress   Neurologic Exam  Mental status: The patient is alert and oriented x 3 at the time of the examination. The patient has apparent normal recent and remote memory, with an apparently normal attention span and concentration ability.   Speech is normal.  Cranial nerves: Extraocular movements are full. Facial strength and sensation are normal. Trapezius and sternocleidomastoid strength is normal. No dysarthria is noted.  The tongue is midline, and the patient has symmetric elevation of the soft palate. Morey Hummingbird was fairly symmetric.   Motor:  Muscle bulk is normal.  Muscle tone is normal. Strength is 5/5 in the arms and legs, including the toes..   Sensory: Sensory testing is intact to pinprick, soft touch and vibration sensation in the arms/hands.  Relative to the knee, vibratory sensation was 20% at the right ankle and  10% at the right great toe   Touch sensation was normal in the feet and legs but temperature sensation was reduced below the ankles  Coordination: Cerebellar testing reveals good finger-nose-finger and heel-to-shin bilaterally.  Gait and station: Station is normal.    Gait is normal. Tandem gait is slightly wide.  The Romberg is negative..   Reflexes: Deep tendon reflexes are symmetric and normal bilaterally in the arms.    DTRs are 2+ at the knees and trace at the ankles.        DIAGNOSTIC DATA (LABS, IMAGING, TESTING) - I reviewed patient records, labs, notes, testing and imaging myself where available.  Lab Results  Component Value Date   WBC 5.6 02/25/2010   HGB 13.8 02/25/2010   HCT 39.8 02/25/2010   MCV 90.4 02/25/2010   PLT 221 02/25/2010      Component Value Date/Time   NA 140 12/24/2014 0754   K 4.4 12/24/2014 0754   CL 106 12/24/2014 0754   CO2 28 12/24/2014 0754   GLUCOSE 103 (H) 12/24/2014 0754   BUN 16 12/24/2014 0754   CREATININE 0.90 12/24/2014 0754   CALCIUM 9.2 12/24/2014 0754   PROT 6.8 10/19/2016 1144   ALBUMIN 4.2 12/24/2014 0754   AST 25 12/24/2014 0754   ALT 21 12/24/2014 0754   ALKPHOS 38 (L) 12/24/2014 0754   BILITOT 0.4 12/24/2014 0754   GFRNONAA >60 02/25/2010 1122   GFRAA  02/25/2010 1122    >60        The eGFR has been calculated using the MDRD equation. This calculation has not been validated in all clinical situations. eGFR's persistently <60 mL/min signify possible Chronic Kidney Disease.   Lab Results  Component Value Date   CHOL 194 12/24/2014   HDL 33.20 (L) 12/24/2014   LDLCALC 121 (H) 12/24/2014   LDLDIRECT 80.7 06/14/2014   TRIG 198.0 (H) 12/24/2014   CHOLHDL 6 12/24/2014      ASSESSMENT AND PLAN  Polyneuropathy  Dysesthesia  History of cervical spinal surgery  1.  Change lamotrigine to 50 mg in am and 100 mg at night.    If still not feeling well, consider change to imipramine 10 - 20 mg at night  2.   Add alpha lipoic acid 600 mg by mouth 3.    rtc 1 year or sooner if problems   Delfino Lovett  August Saucer, MD, PhD 9/93/7169, 6:78 PM Certified in Neurology, Clinical Neurophysiology, Sleep Medicine, Pain Medicine and Neuroimaging  [facility] [address]  [place], [place] [address] [phone]

## 2018-01-17 ENCOUNTER — Ambulatory Visit: Payer: Medicare Other | Admitting: Neurology

## 2018-02-04 ENCOUNTER — Other Ambulatory Visit: Payer: Self-pay | Admitting: Neurology

## 2018-04-05 ENCOUNTER — Encounter: Payer: Self-pay | Admitting: Cardiovascular Disease

## 2018-04-05 ENCOUNTER — Ambulatory Visit: Payer: Medicare Other | Admitting: Cardiovascular Disease

## 2018-04-05 VITALS — BP 148/68 | HR 53 | Ht 69.0 in | Wt 168.0 lb

## 2018-04-05 DIAGNOSIS — I4510 Unspecified right bundle-branch block: Secondary | ICD-10-CM

## 2018-04-05 DIAGNOSIS — R0789 Other chest pain: Secondary | ICD-10-CM

## 2018-04-05 DIAGNOSIS — E782 Mixed hyperlipidemia: Secondary | ICD-10-CM

## 2018-04-05 NOTE — Patient Instructions (Signed)
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
## 2018-10-31 ENCOUNTER — Encounter: Payer: Self-pay | Admitting: Neurology

## 2018-10-31 ENCOUNTER — Ambulatory Visit: Payer: Medicare Other | Admitting: Neurology

## 2018-10-31 VITALS — BP 153/77 | HR 54 | Ht 69.0 in | Wt 171.0 lb

## 2018-10-31 DIAGNOSIS — G629 Polyneuropathy, unspecified: Secondary | ICD-10-CM | POA: Diagnosis not present

## 2018-10-31 DIAGNOSIS — H9190 Unspecified hearing loss, unspecified ear: Secondary | ICD-10-CM

## 2018-10-31 DIAGNOSIS — R208 Other disturbances of skin sensation: Secondary | ICD-10-CM | POA: Diagnosis not present

## 2018-10-31 MED ORDER — LAMOTRIGINE 100 MG PO TABS: 50.0000 mg | ORAL_TABLET | Freq: Two times a day (BID) | ORAL | 4 refills | Status: DC

## 2018-10-31 NOTE — Progress Notes (Signed)
[facility]  PATIENT: Mark Garner DOB: July 03, 1946  REFERRING DOCTOR OR PCP:  Dr. Arnoldo Morale and Dr. Brien Few ([facility]), PCP is Dr. Lauretta Grill [name]) SOURCE: Patient, notes from Dr. Arnoldo Morale, NCV study from Dr. Brien Few, lab results, MRI results and images.  _________________________________   HISTORICAL  CHIEF COMPLAINT:  Chief Complaint  Patient presents with  . Follow-up    RM 13, alone. Last seen 10/31/2017. States he is tolerating lamictal well but has not noticed any improvment in sx. Several months ago, he noticed facial drooping. He drools out of right side of mouth. (This started in the last year). Pt denies speech problems, cognition changes or vision changes.     HISTORY OF PRESENT ILLNESS:   Mark Garner is a 73 y.o. man with polyneuropathy and numbness/tingling in the feet and lower legs.  Update 10/31/2018: He has neuropathic pain in his feet up to the ankle but not higher.   This is unchanged compared to the 10/31/2017 visit.   Lamotrigine 50 mg has reduced the pain and he still notes tingling.    A higher dose (100 mg po bid) was not well tolerated.      He had a concussion 6 years ago and has had decreased hearing (Right worse than left), smell and taste.  He was unresponsive x 20 minutes.   He had imaging at that time.  The hearing loss has been progressive.   He has a strong FH of hearing loss.    Balance is fine.     Update 10/31/2017: He has had neuropathic pain in his feet since 2017. A nerve conduction/EMG  study that year showed changes consistent with neuropathy.   MRI also showed around 10 narrowing, worse on the left at L5-S1 but no definite nerve root compression. I saw him shortly after the NCV/EMG and check SPEP/IFE, and other blood work and they were normal or negative.   He has been on lamotrigine since January 2018. Pain did improve and he is noting that he feels much less energetic and less motivated. He would like to switch to different  medicines.    He notes ting;ing in his feet up towards the knee but not higher.    Being active helps by distraction so he notes it most at night.     He notes mild bilateral hearing loss.  Of note his father, uncle and both brothers have hearing issues.    He also notes reduced taste and smell.      From 01/11/2017: At the last visit, lamotrigine was added and titrated to 100 mg po bid.     His pain has improved about 25-35%.   He still has tingling in the feet but the pain in his toes is better.      There is no numbness above the knees. There is no numbness in the hands or face.  He has not noticed any weakness.  He has felt slightly dizzy but not dramatically.   He feels very slightly more clumsy and has dropped a few items.    Neuropathy history:   In early 2017,, he began to notice more numbness in the feet and then some numbness in the ankle and lower legs. He denies difficulty with gait or balance. He rides his bicycle less than he used to but still about 100 miles every week. He has nocturia but this has been a stable pattern. He does not have difficulty walking in dim light.  He had L4L5  lumbar surgery 2011 and C6-C7 cervical fusion 2002, both with Dr. Arnoldo Morale.   In the past, he was on gabapentin for radicular pain and noted that it made him sleepy.   He stopped after a short time.  Data:   Nerve conduction study performed by Dr. Brien Few 08/12/2016.   The nerve conduction study showed absent sural sensory responses, tibial motor slowing in the arms were normal. EMG showed chronic neuropathic changes in the intrinsic foot muscles.  I also reviewed the MRI of the lumbar spine dated 09/10/2009.   There are degenerative changes at L3-L4, L4-L5 and L5-S1. At L4-L5, there is right foraminal narrowing but there does not appear to be nerve root compression. At L5-S1, there is moderate left foraminal narrowing encroaching upon the left L5 nerve root without causing definite compression.   10/2016:   SPEP/IFE, ANA, ESR, B12 were normal or negative.  REVIEW OF SYSTEMS: Constitutional: No fevers, chills, sweats, or change in appetite Eyes: No visual changes, double vision, eye pain Ear, nose and throat: No hearing loss, ear pain, nasal congestion, sore throat Cardiovascular: No chest pain, palpitations Respiratory: No shortness of breath at rest or with exertion.   No wheezes GastrointestinaI: No nausea, vomiting, diarrhea, abdominal pain, fecal incontinence.    Occ GERD  Genitourinary: No dysuria, urinary retention or frequency.  No nocturia. Musculoskeletal: No neck pain, back pain Integumentary: No rash, pruritus, skin lesions Neurological: as above Psychiatric: No depression at this time.  No anxiety Endocrine: No palpitations, diaphoresis, change in appetite, change in weigh or increased thirst Hematologic/Lymphatic: No anemia, purpura, petechiae. Allergic/Immunologic: No itchy/runny eyes, nasal congestion, recent allergic reactions, rashes  ALLERGIES: Allergies  Allergen Reactions  . Fenofibrate Other (See Comments)    Leg pain  . Gemfibrozil Other (See Comments)    Leg pain  . Niacin And Related     Severe flushing    HOME MEDICATIONS:  Current Outpatient Medications:  .  acetaminophen (TYLENOL) 500 MG tablet, Take 500 mg by mouth every 6 (six) hours as needed.  , Disp: , Rfl:  .  lamoTRIgine (LAMICTAL) 100 MG tablet, Take 0.5 tablets (50 mg total) by mouth 2 (two) times daily., Disp: 90 tablet, Rfl: 4 .  Omega-3 Fatty Acids (FISH OIL) 1000 MG CAPS, Take 4 capsules (4,000 mg total) by mouth daily., Disp: , Rfl: 0 .  Simethicone (GAS-X EXTRA STRENGTH PO), Take 1 capsule by mouth as needed.  , Disp: , Rfl:   PAST MEDICAL HISTORY: Past Medical History:  Diagnosis Date  . Chest pain   . Chest tightness 04/06/2011  . Dysesthesia 10/19/2016  . Heart murmur   . History of cervical spinal surgery 10/19/2016  . History of lumbosacral spine surgery 10/19/2016  .  Hyperlipidemia   . Indigestion   . Normal echocardiogram 05/31/05   normal LV function; trace MR;trace tricuspid regurgitation  . Normal nuclear stress test 07/26/02   no evidence of ischemia; nml LVF  . Polyneuropathy 10/19/2016  . Right bundle branch block   . Trace mitral regurgitation by prior echocardiogram   . Trace tricuspid regurgitation by prior echocardiogram   . Upper airway cough syndrome 09/12/2014   Followed in Pulmonary clinic/ [facility]/ [facility]  - gerd rx 09/12/2014 >>>     PAST SURGICAL HISTORY: Past Surgical History:  Procedure Laterality Date  . SPINE SURGERY     x 2     FAMILY HISTORY: Family History  Problem Relation Age of Onset  . Heart disease Brother 1  MI/ptca    SOCIAL HISTORY:  Social History   Socioeconomic History  . Marital status: Married    Spouse name: Not on file  . Number of children: Not on file  . Years of education: Not on file  . Highest education level: Not on file  Occupational History  . Occupation: Retired  Scientific laboratory technician  . Financial resource strain: Not on file  . Food insecurity:    Worry: Not on file    Inability: Not on file  . Transportation needs:    Medical: Not on file    Non-medical: Not on file  Tobacco Use  . Smoking status: Former Smoker    Packs/day: 1.00    Years: 5.00    Pack years: 5.00    Types: Cigarettes    Last attempt to quit: 10/04/1984    Years since quitting: 34.0  . Smokeless tobacco: Never Used  Substance and Sexual Activity  . Alcohol use: No    Alcohol/week: 0.0 standard drinks  . Drug use: No  . Sexual activity: Not on file  Lifestyle  . Physical activity:    Days per week: Not on file    Minutes per session: Not on file  . Stress: Not on file  Relationships  . Social connections:    Talks on phone: Not on file    Gets together: Not on file    Attends religious service: Not on file    Active member of club or organization: Not on file    Attends meetings of clubs  or organizations: Not on file    Relationship status: Not on file  . Intimate partner violence:    Fear of current or ex partner: Not on file    Emotionally abused: Not on file    Physically abused: Not on file    Forced sexual activity: Not on file  Other Topics Concern  . Not on file  Social History Narrative  . Not on file     PHYSICAL EXAM  Vitals:   10/31/18 1111  BP: (!) 153/77  Pulse: (!) 54  Weight: 171 lb (77.6 kg)  Height: _0  (1.753 m)    Body mass index is 25.25 kg/m.   General: The patient is well-developed and well-nourished and in no acute distress   Neurologic Exam  Mental status: The patient is alert and oriented x 3 at the time of the examination. The patient has apparent normal recent and remote memory, with an apparently normal attention span and concentration ability.   Speech is normal.  Cranial nerves: Extraocular movements are full. Facial strength and sensation are normal. Trapezius and sternocleidomastoid strength is normal. No dysarthria is noted.  The tongue is midline, and the patient has symmetric elevation of the soft palate. Hearing reduced on his right. Weber was midline.   Motor:  Muscle bulk is normal.  Muscle tone is normal. Strength is 5/5 in the arms and legs, including the toes..   Sensory: Sensory testing is intact to pinprick, soft touch and vibration sensation in the arms/hands.  Relative to the knee, vibratory sensation was 30% at the ankle and  0% at the great toe   Touch sensation was normal in the feet and legs  Coordination: Cerebellar testing reveals good finger-nose-finger and heel-to-shin bilaterally.  Gait and station: Station is normal.   Gait is normal. Tandem gait is mildly wide.  The Romberg is negative..   Reflexes: Deep tendon reflexes are symmetric and normal bilaterally in the  arms.    DTRs are 2+ at the knees and absent at the ankles.        DIAGNOSTIC DATA (LABS, IMAGING, TESTING) - I reviewed patient  records, labs, notes, testing and imaging myself where available.  Lab Results  Component Value Date   WBC 5.6 02/25/2010   HGB 13.8 02/25/2010   HCT 39.8 02/25/2010   MCV 90.4 02/25/2010   PLT 221 02/25/2010      Component Value Date/Time   NA 140 12/24/2014 0754   K 4.4 12/24/2014 0754   CL 106 12/24/2014 0754   CO2 28 12/24/2014 0754   GLUCOSE 103 (H) 12/24/2014 0754   BUN 16 12/24/2014 0754   CREATININE 0.90 12/24/2014 0754   CALCIUM 9.2 12/24/2014 0754   PROT 6.8 10/19/2016 1144   ALBUMIN 4.2 12/24/2014 0754   AST 25 12/24/2014 0754   ALT 21 12/24/2014 0754   ALKPHOS 38 (L) 12/24/2014 0754   BILITOT 0.4 12/24/2014 0754   GFRNONAA >60 02/25/2010 1122   GFRAA  02/25/2010 1122    >60        The eGFR has been calculated using the MDRD equation. This calculation has not been validated in all clinical situations. eGFR's persistently <60 mL/min signify possible Chronic Kidney Disease.   Lab Results  Component Value Date   CHOL 194 12/24/2014   HDL 33.20 (L) 12/24/2014   LDLCALC 121 (H) 12/24/2014   LDLDIRECT 80.7 06/14/2014   TRIG 198.0 (H) 12/24/2014   CHOLHDL 6 12/24/2014      ASSESSMENT AND PLAN  Polyneuropathy - Plan: Multiple Myeloma Panel (SPEP&IFE w/QIG)  Dysesthesia  Hearing loss, unspecified hearing loss type, unspecified laterality  1.  He will continue lamotrigine 50 mg twice a day.  If he wants to try a higher dose again he can change to 50 mg in the morning and 100 mg at night.  We will recheck the SPEP/IEF to rule out MGUS as a source of his predominantly large fiber sensory greater than motor polyneuropathy 2.   We discussed that if the hearing loss accelerates or if the asymmetry between the right and the left worsens that I would want to check an MRI.   3.    rtc 1 year or sooner if problems   Richard A. Felecia Shelling, MD, PhD 8/41/3244, 01:02 PM Certified in Neurology, Clinical Neurophysiology, Sleep Medicine, Pain Medicine and  Neuroimaging  [facility] [address] [place], [place] [address] [phone]

## 2018-11-02 LAB — MULTIPLE MYELOMA PANEL, SERUM
ALBUMIN SERPL ELPH-MCNC: 4.1 g/dL (ref 2.9–4.4)
ALBUMIN/GLOB SERPL: 1.8 — AB (ref 0.7–1.7)
Alpha 1: 0.2 g/dL (ref 0.0–0.4)
Alpha2 Glob SerPl Elph-Mcnc: 0.8 g/dL (ref 0.4–1.0)
B-GLOBULIN SERPL ELPH-MCNC: 0.8 g/dL (ref 0.7–1.3)
Gamma Glob SerPl Elph-Mcnc: 0.6 g/dL (ref 0.4–1.8)
Globulin, Total: 2.4 g/dL (ref 2.2–3.9)
IGA/IMMUNOGLOBULIN A, SERUM: 123 mg/dL (ref 61–437)
IGG (IMMUNOGLOBIN G), SERUM: 717 mg/dL (ref 700–1600)
IgM (Immunoglobulin M), Srm: 98 mg/dL (ref 15–143)
Total Protein: 6.5 g/dL (ref 6.0–8.5)

## 2018-11-03 ENCOUNTER — Telehealth: Payer: Self-pay | Admitting: *Deleted

## 2018-11-03 NOTE — Telephone Encounter (Signed)
Called and spoke with pt about lab results per Dr. Felecia Shelling note. He verbalized understanding.

## 2018-11-03 NOTE — Telephone Encounter (Signed)
-----   Message from Britt Bottom, MD sent at 11/02/2018  5:03 PM EST ----- Please let the patient know that the lab work is fine.

## 2019-02-06 ENCOUNTER — Telehealth: Payer: Self-pay | Admitting: Cardiovascular Disease

## 2019-02-06 DIAGNOSIS — T733XXA Exhaustion due to excessive exertion, initial encounter: Secondary | ICD-10-CM

## 2019-02-06 DIAGNOSIS — R079 Chest pain, unspecified: Secondary | ICD-10-CM

## 2019-02-06 NOTE — Telephone Encounter (Signed)
Spoke with patient who states he went for a bike ride yesterday and felt miserable for the last 2 miles. Complains of chest discomfort, fatigue, and irregular and fast feeling pulse. States the amount of fatigue he experienced after the ride was abnormal for him. He states he feels fine today Last seen by Dr. Acie Fredrickson 7/19 and c/o chest pain at that time. States he has been taking antacids since then and has been doing well  Pain resolved after the bike ride but HR remained elevated. Denies n/v, diaphoresis Left arm pain off and on for one month, can feel this when he is turning over in the bed at night. I asked about other episodes of exertional SOB or chest pain and he states he walked up a hill last week and felt more SOB than normal.  I advised that I will forward message to Dr. Acie Fredrickson for advice. Patient verbalized understanding and agreement with plan and thanked me for the call.

## 2019-02-06 NOTE — Telephone Encounter (Signed)
Left message for patient to call back  

## 2019-02-06 NOTE — Telephone Encounter (Signed)
New Message            Patient is calling today because he is not sure if he is having acid reflux or chest pains. Patient states that he did a bike ride on yesterday and his left arm was has being off and on for a month  and his heart rate was elevated for several hours yesterday. Pls call patient to  Advise.

## 2019-02-06 NOTE — Telephone Encounter (Signed)
His symptoms are concerning for unstable angina. Also having palpiations Lets get a exercise myoview and a 30 day event monitor  I will try to call him in the next several days also

## 2019-02-07 NOTE — Telephone Encounter (Signed)
Left message for patient to call back regarding Dr. Elmarie Shiley advice

## 2019-02-07 NOTE — Telephone Encounter (Signed)
Spoke with patient and reviewed Dr. Elmarie Shiley advice. Patient agrees to plan and is aware that he will receive calls from our office staff regarding the event monitor and the exercise myoview. I advised him to call back is symptoms worsen or to proceed to the ED. Patient verbalized understanding and agreement and was thankful for the call.

## 2019-02-08 ENCOUNTER — Other Ambulatory Visit: Payer: Self-pay

## 2019-02-08 ENCOUNTER — Ambulatory Visit (HOSPITAL_COMMUNITY): Payer: Medicare Other | Attending: Cardiovascular Disease

## 2019-02-08 DIAGNOSIS — R079 Chest pain, unspecified: Secondary | ICD-10-CM | POA: Insufficient documentation

## 2019-02-08 DIAGNOSIS — T733XXA Exhaustion due to excessive exertion, initial encounter: Secondary | ICD-10-CM | POA: Insufficient documentation

## 2019-02-08 LAB — MYOCARDIAL PERFUSION IMAGING
LV dias vol: 93 mL (ref 62–150)
LV sys vol: 34 mL
Peak HR: 99 {beats}/min
Rest HR: 53 {beats}/min
SDS: 0
SRS: 0
SSS: 0
TID: 1.18

## 2019-02-08 MED ORDER — TECHNETIUM TC 99M TETROFOSMIN IV KIT
27.5000 | PACK | Freq: Once | INTRAVENOUS | Status: AC | PRN
  Administered 2019-02-08: 27.5 via INTRAVENOUS
  Filled 2019-02-08: qty 28

## 2019-02-08 MED ORDER — TECHNETIUM TC 99M TETROFOSMIN IV KIT
9.6000 | PACK | Freq: Once | INTRAVENOUS | Status: AC | PRN
  Administered 2019-02-08: 9.6 via INTRAVENOUS
  Filled 2019-02-08: qty 10

## 2019-02-08 MED ORDER — REGADENOSON 0.4 MG/5ML IV SOLN
0.4000 mg | Freq: Once | INTRAVENOUS | Status: AC
  Administered 2019-02-08: 0.4 mg via INTRAVENOUS

## 2019-02-20 ENCOUNTER — Telehealth: Payer: Self-pay | Admitting: *Deleted

## 2019-02-20 NOTE — Telephone Encounter (Signed)
Pt has been having chest pain /dyspnea as well as some palpitations. Stress myoview was normal . Agree with sending out event monitor  Please set up a virtual visit in the next week or so  Sooner if his issues are worsening

## 2019-02-20 NOTE — Telephone Encounter (Signed)
Spoke with patient regarding cardiac event monitor.  Patient is still having symptoms and does want to wear the monitor.  Patient enrolled for [employer] to ship 30 day cardiac event monitor to his home.  Patient knows to review instructions, apply monitor and call [employer] at [phone] to send his baseline recording.

## 2019-02-22 NOTE — Telephone Encounter (Signed)
YOUR CARDIOLOGY TEAM HAS ARRANGED FOR AN E-VISIT FOR YOUR APPOINTMENT - PLEASE REVIEW IMPORTANT INFORMATION BELOW SEVERAL DAYS PRIOR TO YOUR APPOINTMENT  Due to the recent COVID-19 pandemic, we are transitioning in-person office visits to tele-medicine visits in an effort to decrease unnecessary exposure to our patients, their families, and staff. These visits are billed to your insurance just like a normal visit is. We also encourage you to sign up for MyChart if you have not already done so. You will need a smartphone if possible. For patients that do not have this, we can still complete the visit using a regular telephone but do prefer a smartphone to enable video when possible. You may have a family member that lives with you that can help. If possible, we also ask that you have a blood pressure cuff and scale at home to measure your blood pressure, heart rate and weight prior to your scheduled appointment. Patients with clinical needs that need an in-person evaluation and testing will still be able to come to the office if absolutely necessary. If you have any questions, feel free to call our office.    YOUR PROVIDER WILL BE USING THE FOLLOWING PLATFORM TO COMPLETE YOUR VISIT: Doxy.Me   . IF USING DOXIMITY or DOXY.ME - The staff will give you instructions on receiving your link to join the meeting the day of your visit.    2-3 DAYS BEFORE YOUR APPOINTMENT  You will receive a telephone call from one of our [employer] team members - your caller ID may say "Unknown caller." If this is a video visit, we will walk you through how to get the video launched on your phone. We will remind you check your blood pressure, heart rate and weight prior to your scheduled appointment. If you have an Apple Watch or Kardia, please upload any pertinent ECG strips the day before or morning of your appointment to [facility]. Our staff will also make sure you have reviewed the consent and agree to move forward with your  scheduled tele-health visit.    THE DAY OF YOUR APPOINTMENT  Approximately 15 minutes prior to your scheduled appointment, you will receive a telephone call from one of [facility] team - your caller ID may say "Unknown caller."  Our staff will confirm medications, vital signs for the day and any symptoms you may be experiencing. Please have this information available prior to the time of visit start. It may also be helpful for you to have a pad of paper and pen handy for any instructions given during your visit. They will also walk you through joining the smartphone meeting if this is a video visit.   CONSENT FOR TELE-HEALTH VISIT - PLEASE REVIEW  I hereby voluntarily request, consent and authorize [facility] and its employed or contracted physicians, physician assistants, nurse practitioners or other licensed health care professionals (the Practitioner), to provide me with telemedicine health care services (the "Services") as deemed necessary by the treating Practitioner. I acknowledge and consent to receive the Services by the Practitioner via telemedicine. I understand that the telemedicine visit will involve communicating with the Practitioner through live audiovisual communication technology and the disclosure of certain medical information by electronic transmission. I acknowledge that I have been given the opportunity to request an in-person assessment or other available alternative prior to the telemedicine visit and am voluntarily participating in the telemedicine visit.  I understand that I have the right to withhold or withdraw my consent to the use of telemedicine in  the course of my care at any time, without affecting my right to future care or treatment, and that the Practitioner or I may terminate the telemedicine visit at any time. I understand that I have the right to inspect all information obtained and/or recorded in the course of the telemedicine visit and may receive copies of  available information for a reasonable fee.  I understand that some of the potential risks of receiving the Services via telemedicine include:  Marland Kitchen Delay or interruption in medical evaluation due to technological equipment failure or disruption; . Information transmitted may not be sufficient (e.g. poor resolution of images) to allow for appropriate medical decision making by the Practitioner; and/or  . In rare instances, security protocols could fail, causing a breach of personal health information.  Furthermore, I acknowledge that it is my responsibility to provide information about my medical history, conditions and care that is complete and accurate to the best of my ability. I acknowledge that Practitioner's advice, recommendations, and/or decision may be based on factors not within their control, such as incomplete or inaccurate data provided by me or distortions of diagnostic images or specimens that may result from electronic transmissions. I understand that the practice of medicine is not an exact science and that Practitioner makes no warranties or guarantees regarding treatment outcomes. I acknowledge that I will receive a copy of this consent concurrently upon execution via email to the email address I last provided but may also request a printed copy by calling the office of [facility].    I understand that my insurance will be billed for this visit.   I have read or had this consent read to me. . I understand the contents of this consent, which adequately explains the benefits and risks of the Services being provided via telemedicine.  . I have been provided ample opportunity to ask questions regarding this consent and the Services and have had my questions answered to my satisfaction. . I give my informed consent for the services to be provided through the use of telemedicine in my medical care  By participating in this telemedicine visit I agree to the above.

## 2019-02-23 ENCOUNTER — Telehealth: Payer: Self-pay | Admitting: *Deleted

## 2019-02-23 NOTE — Telephone Encounter (Signed)
Spoke with patient to confirm his virtual appointment that is scheduled with Dr Acie Fredrickson on 02/27/19 at 9:40 and to obtain verbal consent. He will have vitals ready prior to visit time. He verbalized understanding on receiving msg via [email or web address] as well.  ..   Virtual Visit Pre-Appointment Phone Call  "(Name), I am calling you today to discuss your upcoming appointment. We are currently trying to limit exposure to the virus that causes COVID-19 by seeing patients at home rather than in the office."  1. "What is the BEST phone number to call the day of the visit?" - include this in appointment notes  2. "Do you have or have access to (through Mark [name]) Mark [name] with video capability that we can use for your visit?" Mark Garner. If yes - list this number in appt notes as "cell" (if different from BEST phone #) and list the appointment type as Mark Garner VIDEO visit in appointment notes b. If no - list the appointment type as Mark Garner PHONE visit in appointment notes  3. Confirm consent - "In the setting of the current Covid19 crisis, you are scheduled for Mark Garner (phone or video) visit with your provider on (date) at (time).  Just as we do with many in-office visits, in order for you to participate in this visit, we must obtain consent.  If you'd like, I can send this to your mychart (if signed up) or email for you to review.  Otherwise, I can obtain your verbal consent now.  All virtual visits are billed to your insurance company just like Mark Garner normal visit would be.  By agreeing to Mark Garner virtual visit, we'd like you to understand that the technology does not allow for your provider to perform an examination, and thus may limit your provider's ability to fully assess your condition. If your provider identifies any concerns that need to be evaluated in person, we will make arrangements to do so.  Finally, though the technology is pretty good, we cannot assure that it will always work on either your or our end, and in the setting  of Mark Garner video visit, we may have to convert it to Mark Garner phone-only visit.  In either situation, we cannot ensure that we have Mark Garner secure connection.  Are you willing to proceed?" STAFF: Did the patient verbally acknowledge consent to telehealth visit? Document YES/NO here: Yes  4. Advise patient to be prepared - "Two hours prior to your appointment, go ahead and check your blood pressure, pulse, oxygen saturation, and your weight (if you have the equipment to check those) and write them all down. When your visit starts, your provider will ask you for this information. If you have an Apple Watch or Kardia device, please plan to have heart rate information ready on the day of your appointment. Please have Mark Garner pen and paper handy nearby the day of the visit as well."  5. Give patient instructions for MyChart download to Mark Garner OR Doximity/[email or web address] as below if video visit (depending on what platform provider is using)  6. Inform patient they will receive Mark Garner phone call 15 minutes prior to their appointment time (may be from unknown caller ID) so they should be prepared to answer    TELEPHONE CALL NOTE  Mark [name] has been deemed Mark Garner candidate for Mark Garner follow-up tele-health visit to limit community exposure during the Covid-19 pandemic. I spoke with the patient via phone to ensure availability of phone/video source, confirm preferred email & phone number, and discuss instructions and  expectations.  I reminded Mark [name] to be prepared with any vital sign and/or heart rhythm information that could potentially be obtained via home monitoring, at the time of his visit. I reminded Mark [name] to expect Mark Garner phone call prior to his visit.  Mark [name], CMA 02/23/2019 1:57 PM   INSTRUCTIONS FOR DOWNLOADING THE MYCHART APP TO Mark Garner  - The patient must first make sure to have activated MyChart and know their login information - If Apple, go to [employer] and type in MyChart in the search bar and download the  app. If Android, ask patient to go to [employer] and type in [place] in the search bar and download the app. The app is free but as with any other app downloads, their phone may require them to verify saved payment information or Apple/Android password.  - The patient will need to then log into the app with their MyChart username and password, and select [facility] as their healthcare provider to link the account. When it is time for your visit, go to the MyChart app, find appointments, and click Begin Video Visit. Be sure to Select Allow for your device to access the Microphone and Camera for your visit. You will then be connected, and your provider will be with you shortly.  **If they have any issues connecting, or need assistance please contact MyChart service desk [phone])**  **If using Mark Garner computer, in order to ensure the best quality for their visit they will need to use either of the following Internet Browsers: [employer], or Google Chrome**  IF USING DOXIMITY or DOXY.ME - The patient will receive Mark Garner link just prior to their visit by text.     FULL LENGTH CONSENT FOR TELE-HEALTH VISIT   I hereby voluntarily request, consent and authorize [facility] and its employed or contracted physicians, physician assistants, nurse practitioners or other licensed health care professionals (the Practitioner), to provide me with telemedicine health care services (the "Services") as deemed necessary by the treating Practitioner. I acknowledge and consent to receive the Services by the Practitioner via telemedicine. I understand that the telemedicine visit will involve communicating with the Practitioner through live audiovisual communication technology and the disclosure of certain medical information by electronic transmission. I acknowledge that I have been given the opportunity to request an in-person assessment or other available alternative prior to the telemedicine visit  and am voluntarily participating in the telemedicine visit.  I understand that I have the right to withhold or withdraw my consent to the use of telemedicine in the course of my care at any time, without affecting my right to future care or treatment, and that the Practitioner or I may terminate the telemedicine visit at any time. I understand that I have the right to inspect all information obtained and/or recorded in the course of the telemedicine visit and may receive copies of available information for Mark Garner reasonable fee.  I understand that some of the potential risks of receiving the Services via telemedicine include:  Marland Kitchen Delay or interruption in medical evaluation due to technological equipment failure or disruption; . Information transmitted may not be sufficient (e.g. poor resolution of images) to allow for appropriate medical decision making by the Practitioner; and/or  . In rare instances, security protocols could fail, causing Mark Garner breach of personal health information.  Furthermore, I acknowledge that it is my responsibility to provide information about my medical history, conditions and care that is complete and accurate to  the best of my ability. I acknowledge that Practitioner's advice, recommendations, and/or decision may be based on factors not within their control, such as incomplete or inaccurate data provided by me or distortions of diagnostic images or specimens that may result from electronic transmissions. I understand that the practice of medicine is not an exact science and that Practitioner makes no warranties or guarantees regarding treatment outcomes. I acknowledge that I will receive Mark Garner copy of this consent concurrently upon execution via email to the email address I last provided but may also request Mark Garner printed copy by calling the office of [facility].    I understand that my insurance will be billed for this visit.   I have read or had this consent read to me. . I understand  the contents of this consent, which adequately explains the benefits and risks of the Services being provided via telemedicine.  . I have been provided ample opportunity to ask questions regarding this consent and the Services and have had my questions answered to my satisfaction. . I give my informed consent for the services to be provided through the use of telemedicine in my medical care  By participating in this telemedicine visit I agree to the above.

## 2019-02-27 ENCOUNTER — Other Ambulatory Visit: Payer: Self-pay

## 2019-02-27 ENCOUNTER — Encounter: Payer: Self-pay | Admitting: Cardiovascular Disease

## 2019-02-27 ENCOUNTER — Telehealth (INDEPENDENT_AMBULATORY_CARE_PROVIDER_SITE_OTHER): Payer: Medicare Other | Admitting: Cardiovascular Disease

## 2019-02-27 VITALS — BP 133/68 | HR 63 | Ht 69.0 in | Wt 167.0 lb

## 2019-02-27 DIAGNOSIS — R0789 Other chest pain: Secondary | ICD-10-CM

## 2019-02-27 DIAGNOSIS — R002 Palpitations: Secondary | ICD-10-CM

## 2019-02-27 DIAGNOSIS — Z7189 Other specified counseling: Secondary | ICD-10-CM
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
## 2019-02-27 NOTE — Patient Instructions (Addendum)
Medication Instructions:  Your physician recommends that you continue on your current medications as directed. Please refer to the Current Medication list given to you today.  If you need a refill on your cardiac medications before your next appointment, please call your pharmacy.    Lab work: None Ordered    Testing/Procedures: We will call you with the results of your heart monitor at the end of the monitoring period or before if problems are detected   Follow-Up: You have a follow-up appointment with Dr. Acie Fredrickson on Friday August 28 at 4:00 pm In the future you may see one of the following Advanced Practice Providers on your designated Care Team: Richardson Dopp, PA-C [place], [place] . Daune Perch, NP

## 2019-03-01 ENCOUNTER — Encounter: Payer: Self-pay | Admitting: Cardiovascular Disease

## 2019-03-01 ENCOUNTER — Ambulatory Visit (INDEPENDENT_AMBULATORY_CARE_PROVIDER_SITE_OTHER): Payer: Medicare Other

## 2019-03-01 DIAGNOSIS — R079 Chest pain, unspecified: Secondary | ICD-10-CM | POA: Diagnosis not present

## 2019-03-01 DIAGNOSIS — R000 Tachycardia, unspecified: Secondary | ICD-10-CM

## 2019-03-09 ENCOUNTER — Telehealth: Payer: Self-pay | Admitting: Cardiovascular Disease

## 2019-03-09 NOTE — Telephone Encounter (Signed)
I spoke to the patient who has been experiencing some pain since his stress test last month.  A few days after the test he began to notice that when he exerts himself, especially when bike riding, he notices a pain starting in his chest that "creeps" up to his neck and jaw.  The jaw feels very painful, "like cement in the mouth."    When he stops to rest, it immediately subsides.  He researched the medication used for the [place] and discovered these symptoms, but usually short-lived.  He uses hot packs and this relieves it also.  He feels great presently, but will monitor over the weekend and notify us on Monday.  If things worsen, he will go to the ED for further evaluation.

## 2019-03-09 NOTE — Telephone Encounter (Signed)
New Message  Patient states that he had a nuclear stress test. He said that he is having side effects from the test such as neck, chest and jaw pain. He wants to know how long should the side effects last. Please call to discuss.

## 2019-03-12 ENCOUNTER — Encounter: Payer: Self-pay | Admitting: Cardiovascular Disease

## 2019-03-12 ENCOUNTER — Telehealth (INDEPENDENT_AMBULATORY_CARE_PROVIDER_SITE_OTHER): Payer: Medicare Other | Admitting: Cardiovascular Disease

## 2019-03-12 ENCOUNTER — Other Ambulatory Visit: Payer: Medicare Other | Admitting: *Deleted

## 2019-03-12 ENCOUNTER — Other Ambulatory Visit: Payer: Self-pay

## 2019-03-12 ENCOUNTER — Telehealth: Payer: Self-pay

## 2019-03-12 ENCOUNTER — Other Ambulatory Visit (HOSPITAL_COMMUNITY)
Admission: RE | Admit: 2019-03-12 | Discharge: 2019-03-12 | Disposition: A | Discharge status: Home / Self Care | Payer: Medicare Other | Source: Ambulatory Visit | Attending: Cardiovascular Disease | Admitting: Cardiovascular Disease

## 2019-03-12 ENCOUNTER — Other Ambulatory Visit: Payer: Self-pay | Admitting: Cardiovascular Disease

## 2019-03-12 VITALS — BP 139/71 | HR 67 | Ht 69.0 in | Wt 169.0 lb

## 2019-03-12 DIAGNOSIS — T733XXA Exhaustion due to excessive exertion, initial encounter: Secondary | ICD-10-CM

## 2019-03-12 DIAGNOSIS — R0789 Other chest pain: Secondary | ICD-10-CM

## 2019-03-12 DIAGNOSIS — I200 Unstable angina: Secondary | ICD-10-CM

## 2019-03-12 DIAGNOSIS — Z7189 Other specified counseling: Secondary | ICD-10-CM

## 2019-03-12 DIAGNOSIS — Z1159 Encounter for screening for other viral diseases: Secondary | ICD-10-CM | POA: Insufficient documentation

## 2019-03-12 LAB — TROPONIN T: Troponin T TROPT: 0.04 ng/mL (ref <0.011)

## 2019-03-12 MED ORDER — NITROGLYCERIN 0.4 MG SL SUBL: 0.4000 mg | SUBLINGUAL_TABLET | SUBLINGUAL | 6 refills | Status: DC | PRN

## 2019-03-12 MED ORDER — METOPROLOL TARTRATE 25 MG PO TABS: 12.5000 mg | ORAL_TABLET | Freq: Two times a day (BID) | ORAL | 3 refills | Status: DC

## 2019-03-12 MED ORDER — ISOSORBIDE MONONITRATE ER 30 MG PO TB24: 30.0000 mg | ORAL_TABLET | Freq: Every day | ORAL | 11 refills | Status: DC

## 2019-03-12 MED ORDER — ASPIRIN EC 81 MG PO TBEC: 81.0000 mg | DELAYED_RELEASE_TABLET | Freq: Every day | ORAL | Status: DC

## 2019-03-12 NOTE — Telephone Encounter (Signed)
Monitor readings reviewed by Dr. Acie Fredrickson who advised patient start metoprolol tartrate 12.5 mg BID today. We will continue to monitor and patient is aware to call 911 if his chest discomfort (located more in shoulder/jaw area for this patient) returns and is not relieved by NTG. Patient is scheduled for cath on 6/11 with Dr. Burt Knack. Patient verbalized understanding and agreement with plan of care and agrees to call back with questions or concerns. He thanked me for the call.

## 2019-03-12 NOTE — Telephone Encounter (Signed)
I received Critical Alert on patient's Troponin level 0.040 from [place], Psychologist, sport and exercise.  I notified DOD (Dr Irish Lack) and he reached out to Dr Acie Fredrickson who was already aware and had spoken to the patient.

## 2019-03-12 NOTE — Addendum Note (Signed)
Addended by: Emmaline Life on: 03/12/2019 12:06 PM   Modules accepted: Orders

## 2019-03-12 NOTE — Telephone Encounter (Signed)
Follow up   Patient states that he is returning your call. Please call.

## 2019-03-12 NOTE — Telephone Encounter (Signed)
Critical Monitors came into the clinic showing AFRVR and VT. Dr Elmarie Shiley RN is aware and will alert Dr Acie Fredrickson to review when they are uploaded.

## 2019-03-12 NOTE — Telephone Encounter (Signed)
Patient had virtual visit with Dr. Acie Fredrickson today to follow-up on symptoms. He is being scheduled for cardiac cath.

## 2019-03-12 NOTE — Patient Instructions (Addendum)
Medication Instructions:  Your physician has recommended you make the following change in your medication:  TAKE Nitroglycerin sublingual as needed for chest/jaw/shoulder discomfort  START Aspirin 81 mg once daily   If you need a refill on your cardiac medications before your next appointment, please call your pharmacy.    Follow-Up: Your physician recommends that you have a follow-up appointment on Friday August 28 at 4:00 pm with Dr. Vilinda Boehringer are scheduled for a Cardiac Catheterization on Thursday, June 11 with Dr. [name].  1. Please arrive at the [facility] (Main Entrance A) at [facility]: [address] [place], [place] [address] at 7:00 AM (This time is two hours before your procedure to ensure your preparation). Free valet parking service is available.   Special note: Every effort is made to have your procedure done on time. Please understand that emergencies sometimes delay scheduled procedures.  2. Diet: Do not eat solid foods after midnight.  The patient may have clear liquids until 5am upon the day of the procedure.  3. Labs: You will need to have blood drawn on Monday, June 8 at [facility] at [facility]. [address], [place]   Your Pre-procedure COVID-19 Testing will be done on Monday June 8 at [facility] at [address] [place], [place] [address] After your swab you will be given a mask to wear and instructed to go home and quarantine/no visitors until after your procedure. If you test positive you will be notified and your procedure will be cancelled.   4. Medication instructions in preparation for your procedure:  START Aspirin 81 mg once daily  On the morning of your procedure, take your Aspirin and any morning medicines NOT listed above.  You may use sips of water.  5. Plan for one night stay--bring personal belongings. 6. Bring a current list of your medications and current  insurance cards. 7. You MUST have a responsible person to drive you home. 8. Someone MUST be with you the first 24 hours after you arrive home or your discharge will be delayed. 9. Please wear clothes that are easy to get on and off and wear slip-on shoes.  Thank you for allowing [name] to care for you!   -- [facility]
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
## 2019-03-13 LAB — BASIC METABOLIC PANEL
BUN/Creatinine Ratio: 14 (ref 10–24)
BUN: 13 mg/dL (ref 8–27)
CO2: 20 mmol/L (ref 20–29)
Calcium: 9.1 mg/dL (ref 8.6–10.2)
Chloride: 105 mmol/L (ref 96–106)
Creatinine, Ser: 0.91 mg/dL (ref 0.76–1.27)
GFR calc Af Amer: 96 mL/min/{1.73_m2} (ref >59)
GFR calc non Af Amer: 83 mL/min/{1.73_m2} (ref >59)
Glucose: 148 mg/dL — ABNORMAL HIGH (ref 65–99)
Potassium: 4.2 mmol/L (ref 3.5–5.2)
Sodium: 139 mmol/L (ref 134–144)

## 2019-03-13 LAB — CBC
Hematocrit: 38.1 % (ref 37.5–51.0)
Hemoglobin: 13.4 g/dL (ref 13.0–17.7)
MCH: 30.7 pg (ref 26.6–33.0)
MCHC: 35.2 g/dL (ref 31.5–35.7)
MCV: 87 fL (ref 79–97)
Platelets: 301 10*3/uL (ref 150–450)
RBC: 4.37 x10E6/uL (ref 4.14–5.80)
RDW: 13.2 % (ref 11.6–15.4)
WBC: 6.4 10*3/uL (ref 3.4–10.8)

## 2019-03-14 ENCOUNTER — Telehealth: Payer: Self-pay | Admitting: Cardiovascular Disease

## 2019-03-14 ENCOUNTER — Other Ambulatory Visit: Payer: Self-pay

## 2019-03-14 ENCOUNTER — Encounter (HOSPITAL_COMMUNITY): Payer: Self-pay | Admitting: Physician Assistant

## 2019-03-14 ENCOUNTER — Encounter (HOSPITAL_COMMUNITY)
Admission: EM | Disposition: A | Discharge status: Home / Self Care | Payer: Medicare Other | Source: Home / Self Care | Attending: Cardiology

## 2019-03-14 ENCOUNTER — Inpatient Hospital Stay (HOSPITAL_COMMUNITY)
Admission: EM | Admit: 2019-03-14 | Discharge: 2019-03-15 | DRG: 247 | Disposition: A | Discharge status: Home / Self Care | Payer: Medicare Other | Attending: Cardiology | Admitting: Cardiology

## 2019-03-14 DIAGNOSIS — I10 Essential (primary) hypertension: Secondary | ICD-10-CM

## 2019-03-14 DIAGNOSIS — Z87891 Personal history of nicotine dependence: Secondary | ICD-10-CM

## 2019-03-14 DIAGNOSIS — R6884 Jaw pain: Secondary | ICD-10-CM | POA: Diagnosis present

## 2019-03-14 DIAGNOSIS — Z955 Presence of coronary angioplasty implant and graft: Secondary | ICD-10-CM

## 2019-03-14 DIAGNOSIS — R0789 Other chest pain: Secondary | ICD-10-CM

## 2019-03-14 DIAGNOSIS — I214 Non-ST elevation (NSTEMI) myocardial infarction: Principal | ICD-10-CM | POA: Diagnosis present

## 2019-03-14 DIAGNOSIS — I200 Unstable angina: Secondary | ICD-10-CM | POA: Diagnosis present

## 2019-03-14 DIAGNOSIS — I480 Paroxysmal atrial fibrillation: Secondary | ICD-10-CM | POA: Diagnosis present

## 2019-03-14 DIAGNOSIS — I472 Ventricular tachycardia: Secondary | ICD-10-CM | POA: Diagnosis present

## 2019-03-14 DIAGNOSIS — K219 Gastro-esophageal reflux disease without esophagitis: Secondary | ICD-10-CM | POA: Diagnosis present

## 2019-03-14 DIAGNOSIS — G629 Polyneuropathy, unspecified: Secondary | ICD-10-CM | POA: Diagnosis present

## 2019-03-14 DIAGNOSIS — Z8249 Family history of ischemic heart disease and other diseases of the circulatory system: Secondary | ICD-10-CM

## 2019-03-14 DIAGNOSIS — I25110 Atherosclerotic heart disease of native coronary artery with unstable angina pectoris: Secondary | ICD-10-CM | POA: Diagnosis present

## 2019-03-14 DIAGNOSIS — Z20828 Contact with and (suspected) exposure to other viral communicable diseases: Secondary | ICD-10-CM | POA: Diagnosis present

## 2019-03-14 DIAGNOSIS — E782 Mixed hyperlipidemia: Secondary | ICD-10-CM | POA: Diagnosis not present

## 2019-03-14 DIAGNOSIS — Z7982 Long term (current) use of aspirin: Secondary | ICD-10-CM

## 2019-03-14 DIAGNOSIS — I4510 Unspecified right bundle-branch block: Secondary | ICD-10-CM | POA: Diagnosis present

## 2019-03-14 DIAGNOSIS — Z888 Allergy status to other drugs, medicaments and biological substances status: Secondary | ICD-10-CM

## 2019-03-14 DIAGNOSIS — E785 Hyperlipidemia, unspecified: Secondary | ICD-10-CM | POA: Diagnosis present

## 2019-03-14 HISTORY — DX: Unstable angina: I20.0

## 2019-03-14 HISTORY — PX: CORONARY STENT INTERVENTION: CATH118234

## 2019-03-14 HISTORY — PX: LEFT HEART CATH AND CORONARY ANGIOGRAPHY: CATH118249

## 2019-03-14 LAB — TROPONIN I
Troponin I: 0.23 ng/mL (ref <0.03)
Troponin I: 0.29 ng/mL (ref <0.03)
Troponin I: 0.55 ng/mL (ref <0.03)

## 2019-03-14 LAB — CBC
HCT: 36.7 % — ABNORMAL LOW (ref 39.0–52.0)
Hemoglobin: 12.5 g/dL — ABNORMAL LOW (ref 13.0–17.0)
MCH: 30.3 pg (ref 26.0–34.0)
MCHC: 34.1 g/dL (ref 30.0–36.0)
MCV: 89.1 fL (ref 80.0–100.0)
Platelets: 258 10*3/uL (ref 150–400)
RBC: 4.12 MIL/uL — ABNORMAL LOW (ref 4.22–5.81)
RDW: 12.3 % (ref 11.5–15.5)
WBC: 7.7 10*3/uL (ref 4.0–10.5)
nRBC: 0 % (ref 0.0–0.2)

## 2019-03-14 LAB — BASIC METABOLIC PANEL
Anion gap: 10 (ref 5–15)
BUN: 13 mg/dL (ref 8–23)
CO2: 22 mmol/L (ref 22–32)
Calcium: 8.8 mg/dL — ABNORMAL LOW (ref 8.9–10.3)
Chloride: 108 mmol/L (ref 98–111)
Creatinine, Ser: 0.9 mg/dL (ref 0.61–1.24)
GFR calc Af Amer: >60 mL/min (ref >60)
GFR calc non Af Amer: >60 mL/min (ref >60)
Glucose, Bld: 110 mg/dL — ABNORMAL HIGH (ref 70–99)
Potassium: 4.3 mmol/L (ref 3.5–5.1)
Sodium: 140 mmol/L (ref 135–145)

## 2019-03-14 LAB — NOVEL CORONAVIRUS, NAA (HOSP ORDER, SEND-OUT TO REF LAB; TAT 18-24 HRS): SARS-CoV-2, NAA: NOT DETECTED

## 2019-03-14 LAB — LIPID PANEL
Cholesterol: 168 mg/dL (ref 0–200)
HDL: 30 mg/dL — ABNORMAL LOW (ref >40)
LDL Cholesterol: 95 mg/dL (ref 0–99)
Total CHOL/HDL Ratio: 5.6 RATIO
Triglycerides: 217 mg/dL — ABNORMAL HIGH (ref <150)
VLDL: 43 mg/dL — ABNORMAL HIGH (ref 0–40)

## 2019-03-14 LAB — POCT ACTIVATED CLOTTING TIME
Activated Clotting Time: 263 seconds
Activated Clotting Time: 246 seconds
Activated Clotting Time: 285 seconds

## 2019-03-14 LAB — TSH: TSH: 4.319 u[IU]/mL (ref 0.350–4.500)

## 2019-03-14 SURGERY — LEFT HEART CATH AND CORONARY ANGIOGRAPHY
Anesthesia: LOCAL

## 2019-03-14 MED ORDER — METOPROLOL TARTRATE 12.5 MG HALF TABLET
12.5000 mg | ORAL_TABLET | Freq: Two times a day (BID) | ORAL | Status: DC
  Administered 2019-03-14 – 2019-03-15 (×2): 12.5 mg via ORAL
  Filled 2019-03-14 (×2): qty 1

## 2019-03-14 MED ORDER — MORPHINE SULFATE (PF) 2 MG/ML IV SOLN: 2.0000 mg | INTRAVENOUS | Status: DC | PRN

## 2019-03-14 MED ORDER — OXYCODONE HCL 5 MG PO TABS: 5.0000 mg | ORAL_TABLET | ORAL | Status: DC | PRN

## 2019-03-14 MED ORDER — IOHEXOL 350 MG/ML SOLN
INTRAVENOUS | Status: DC | PRN
  Administered 2019-03-14: 18:00:00 220 mL via INTRACARDIAC

## 2019-03-14 MED ORDER — SODIUM CHLORIDE 0.9 % IV SOLN: 250.0000 mL | INTRAVENOUS | Status: DC | PRN

## 2019-03-14 MED ORDER — NITROGLYCERIN IN D5W 200-5 MCG/ML-% IV SOLN
0.0000 ug/min | INTRAVENOUS | Status: DC
  Administered 2019-03-14: 5 ug/min via INTRAVENOUS
  Filled 2019-03-14: qty 250

## 2019-03-14 MED ORDER — LAMOTRIGINE 100 MG PO TABS
50.0000 mg | ORAL_TABLET | Freq: Two times a day (BID) | ORAL | Status: DC
  Administered 2019-03-14 – 2019-03-15 (×2): 50 mg via ORAL
  Filled 2019-03-14 (×2): qty 1

## 2019-03-14 MED ORDER — HEPARIN SODIUM (PORCINE) 1000 UNIT/ML IJ SOLN
INTRAMUSCULAR | Status: DC | PRN
  Administered 2019-03-14: 3000 [IU] via INTRAVENOUS
  Administered 2019-03-14 (×2): 4000 [IU] via INTRAVENOUS

## 2019-03-14 MED ORDER — LIDOCAINE HCL (PF) 1 % IJ SOLN
INTRAMUSCULAR | Status: AC
  Filled 2019-03-14: qty 30

## 2019-03-14 MED ORDER — NITROGLYCERIN 0.4 MG SL SUBL: 0.4000 mg | SUBLINGUAL_TABLET | SUBLINGUAL | Status: DC | PRN

## 2019-03-14 MED ORDER — MIDAZOLAM HCL 2 MG/2ML IJ SOLN
INTRAMUSCULAR | Status: AC
  Filled 2019-03-14: qty 2

## 2019-03-14 MED ORDER — SIMETHICONE 80 MG PO CHEW: 80.0000 mg | CHEWABLE_TABLET | Freq: Four times a day (QID) | ORAL | Status: DC | PRN

## 2019-03-14 MED ORDER — TICAGRELOR 90 MG PO TABS
ORAL_TABLET | ORAL | Status: DC | PRN
  Administered 2019-03-14: 180 mg via ORAL

## 2019-03-14 MED ORDER — LABETALOL HCL 5 MG/ML IV SOLN
INTRAVENOUS | Status: AC
  Filled 2019-03-14: qty 4

## 2019-03-14 MED ORDER — SODIUM CHLORIDE 0.9 % WEIGHT BASED INFUSION: 1.0000 mL/kg/h | INTRAVENOUS | Status: AC

## 2019-03-14 MED ORDER — HEPARIN (PORCINE) IN NACL 1000-0.9 UT/500ML-% IV SOLN
INTRAVENOUS | Status: DC | PRN
  Administered 2019-03-14 (×2): 500 mL

## 2019-03-14 MED ORDER — ONDANSETRON HCL 4 MG/2ML IJ SOLN: 4.0000 mg | Freq: Four times a day (QID) | INTRAMUSCULAR | Status: DC | PRN

## 2019-03-14 MED ORDER — HEPARIN (PORCINE) IN NACL 1000-0.9 UT/500ML-% IV SOLN
INTRAVENOUS | Status: AC
  Filled 2019-03-14: qty 1000

## 2019-03-14 MED ORDER — LABETALOL HCL 5 MG/ML IV SOLN: 10.0000 mg | INTRAVENOUS | Status: AC | PRN

## 2019-03-14 MED ORDER — SODIUM CHLORIDE 0.9% FLUSH: 3.0000 mL | INTRAVENOUS | Status: DC | PRN

## 2019-03-14 MED ORDER — SODIUM CHLORIDE 0.9% FLUSH
3.0000 mL | Freq: Two times a day (BID) | INTRAVENOUS | Status: DC
  Administered 2019-03-15: 3 mL via INTRAVENOUS

## 2019-03-14 MED ORDER — SODIUM CHLORIDE 0.9% FLUSH: 3.0000 mL | Freq: Two times a day (BID) | INTRAVENOUS | Status: DC

## 2019-03-14 MED ORDER — SODIUM CHLORIDE 0.9 % WEIGHT BASED INFUSION: 3.0000 mL/kg/h | INTRAVENOUS | Status: DC

## 2019-03-14 MED ORDER — ACETAMINOPHEN 325 MG PO TABS
650.0000 mg | ORAL_TABLET | ORAL | Status: DC | PRN
  Administered 2019-03-14: 650 mg via ORAL
  Filled 2019-03-14: qty 2

## 2019-03-14 MED ORDER — FENTANYL CITRATE (PF) 100 MCG/2ML IJ SOLN
INTRAMUSCULAR | Status: DC | PRN
  Administered 2019-03-14 (×2): 25 ug via INTRAVENOUS
  Administered 2019-03-14: 50 ug via INTRAVENOUS

## 2019-03-14 MED ORDER — VERAPAMIL HCL 2.5 MG/ML IV SOLN
INTRAVENOUS | Status: DC | PRN
  Administered 2019-03-14: 10 mL via INTRA_ARTERIAL

## 2019-03-14 MED ORDER — ISOSORBIDE MONONITRATE ER 30 MG PO TB24
30.0000 mg | ORAL_TABLET | Freq: Every day | ORAL | Status: DC
  Administered 2019-03-15: 30 mg via ORAL
  Filled 2019-03-14: qty 1

## 2019-03-14 MED ORDER — LABETALOL HCL 5 MG/ML IV SOLN
INTRAVENOUS | Status: DC | PRN
  Administered 2019-03-14: 20 mg via INTRAVENOUS

## 2019-03-14 MED ORDER — MIDAZOLAM HCL 2 MG/2ML IJ SOLN
INTRAMUSCULAR | Status: DC | PRN
  Administered 2019-03-14 (×2): 1 mg via INTRAVENOUS
  Administered 2019-03-14: 2 mg via INTRAVENOUS

## 2019-03-14 MED ORDER — SIMETHICONE 125 MG PO CAPS: ORAL_CAPSULE | Freq: Four times a day (QID) | ORAL | Status: DC | PRN

## 2019-03-14 MED ORDER — FENTANYL CITRATE (PF) 100 MCG/2ML IJ SOLN
INTRAMUSCULAR | Status: AC
  Filled 2019-03-14: qty 2

## 2019-03-14 MED ORDER — HEPARIN (PORCINE) 25000 UT/250ML-% IV SOLN
950.0000 [IU]/h | INTRAVENOUS | Status: DC
  Administered 2019-03-14: 950 [IU]/h via INTRAVENOUS
  Filled 2019-03-14: qty 250

## 2019-03-14 MED ORDER — ALPRAZOLAM 0.25 MG PO TABS
0.2500 mg | ORAL_TABLET | Freq: Two times a day (BID) | ORAL | Status: DC | PRN
  Administered 2019-03-14: 0.25 mg via ORAL
  Filled 2019-03-14: qty 1

## 2019-03-14 MED ORDER — FAMOTIDINE 20 MG PO TABS
20.0000 mg | ORAL_TABLET | Freq: Every day | ORAL | Status: DC
  Administered 2019-03-14 – 2019-03-15 (×2): 20 mg via ORAL
  Filled 2019-03-14 (×2): qty 1

## 2019-03-14 MED ORDER — HYDRALAZINE HCL 20 MG/ML IJ SOLN: 10.0000 mg | INTRAMUSCULAR | Status: AC | PRN

## 2019-03-14 MED ORDER — HEPARIN SODIUM (PORCINE) 1000 UNIT/ML IJ SOLN
INTRAMUSCULAR | Status: AC
  Filled 2019-03-14: qty 1

## 2019-03-14 MED ORDER — NITROGLYCERIN 1 MG/10 ML FOR IR/CATH LAB
INTRA_ARTERIAL | Status: DC | PRN
  Administered 2019-03-14: 150 ug via INTRACORONARY
  Administered 2019-03-14: 200 ug via INTRACORONARY
  Administered 2019-03-14: 150 ug via INTRACORONARY
  Administered 2019-03-14: 100 ug via INTRACORONARY
  Administered 2019-03-14: 150 ug via INTRACORONARY

## 2019-03-14 MED ORDER — SODIUM CHLORIDE 0.9 % WEIGHT BASED INFUSION
1.0000 mL/kg/h | INTRAVENOUS | Status: DC
  Administered 2019-03-14: 1 mL/kg/h via INTRAVENOUS

## 2019-03-14 MED ORDER — VERAPAMIL HCL 2.5 MG/ML IV SOLN
INTRAVENOUS | Status: AC
  Filled 2019-03-14: qty 2

## 2019-03-14 MED ORDER — ZOLPIDEM TARTRATE 5 MG PO TABS: 5.0000 mg | ORAL_TABLET | Freq: Every evening | ORAL | Status: DC | PRN

## 2019-03-14 MED ORDER — LIDOCAINE HCL (PF) 1 % IJ SOLN
INTRAMUSCULAR | Status: DC | PRN
  Administered 2019-03-14: 2 mL

## 2019-03-14 MED ORDER — TICAGRELOR 90 MG PO TABS
90.0000 mg | ORAL_TABLET | Freq: Two times a day (BID) | ORAL | Status: DC
  Administered 2019-03-15 (×2): 90 mg via ORAL
  Filled 2019-03-14 (×2): qty 1

## 2019-03-14 MED ORDER — HEPARIN BOLUS VIA INFUSION
4000.0000 [IU] | Freq: Once | INTRAVENOUS | Status: AC
  Administered 2019-03-14: 4000 [IU] via INTRAVENOUS
  Filled 2019-03-14: qty 4000

## 2019-03-14 SURGICAL SUPPLY — 19 items
BALLN SAPPHIRE 2.0X12 (BALLOONS) ×2
BALLN SAPPHIRE ~~LOC~~ 2.5X12 (BALLOONS) ×1 IMPLANT
BALLN SAPPHIRE ~~LOC~~ 3.0X15 (BALLOONS) ×1 IMPLANT
BALLOON SAPPHIRE 2.0X12 (BALLOONS) IMPLANT
CATH 5FR JL3.5 JR4 ANG PIG MP (CATHETERS) ×1 IMPLANT
CATH VISTA GUIDE 6FR XBLAD3.5 (CATHETERS) ×1 IMPLANT
DEVICE RAD COMP TR BAND LRG (VASCULAR PRODUCTS) ×1 IMPLANT
GLIDESHEATH SLEND SS 6F .021 (SHEATH) ×1 IMPLANT
GUIDEWIRE INQWIRE 1.5J.035X260 (WIRE) IMPLANT
INQWIRE 1.5J .035X260CM (WIRE) ×2
KIT ENCORE 26 ADVANTAGE (KITS) ×1 IMPLANT
KIT HEART LEFT (KITS) ×2 IMPLANT
PACK CARDIAC CATHETERIZATION (CUSTOM PROCEDURE TRAY) ×2 IMPLANT
STENT RESOLUTE ONYX 2.0X15 (Permanent Stent) ×1 IMPLANT
STENT RESOLUTE ONYX 2.25X22 (Permanent Stent) ×1 IMPLANT
STENT RESOLUTE ONYX 2.5X22 (Permanent Stent) ×1 IMPLANT
TRANSDUCER W/STOPCOCK (MISCELLANEOUS) ×2 IMPLANT
TUBING CIL FLEX 10 FLL-RA (TUBING) ×2 IMPLANT
WIRE COUGAR XT STRL 190CM (WIRE) ×1 IMPLANT

## 2019-03-14 NOTE — ED Triage Notes (Signed)
Pt BIB [facility] for chest pain. Per EMS patient woke up this morning "not feeling himself." Pt reports left sided jaw pain at a 3/10 that was relieved with 3 nitro that the patient took at home. Pt is on a heart monitor at home and reports that his doctor called him to say that his monitor showed possible a-fib/vtach on Sunday. Pt reports he is scheduled for a heart cath tomorrow. Pt denying any pain at present time. EMS reports patient was diaphoretic upon their arrival.

## 2019-03-14 NOTE — ED Notes (Signed)
ED TO INPATIENT HANDOFF REPORT  ED Nurse Name and Phone #:  Darrold Span [phone]  S Name/Age/Gender Mark Garner 73 y.o. male Room/Bed: 032C/032C  Code Status   Code Status: Full Code  Home/SNF/Other Home Patient oriented to: self, place, time and situation Is this baseline? Yes   Triage Complete: Triage complete  Chief Complaint Chest Pain  Triage Note Pt BIB [facility] for chest pain. Per EMS patient woke up this morning "not feeling himself." Pt reports left sided jaw pain at a 3/10 that was relieved with 3 nitro that the patient took at home. Pt is on a heart monitor at home and reports that his doctor called him to say that his monitor showed possible a-fib/vtach on Sunday. Pt reports he is scheduled for a heart cath tomorrow. Pt denying any pain at present time. EMS reports patient was diaphoretic upon their arrival.    Allergies Allergies  Allergen Reactions  . Fenofibrate Other (See Comments)    Leg pain  . Gemfibrozil Other (See Comments)    Leg pain  . Niacin And Related     Severe flushing    Level of Care/Admitting Diagnosis ED Disposition    ED Disposition Condition Comment   Admit  Hospital Area: [facility] [[id number]]  Level of Care: Telemetry Cardiac [103]  Covid Evaluation: Confirmed COVID Negative  Diagnosis: Unstable angina [facility]) [[id number]]  Admitting Physician: Dorothy Spark [[id number]]  Attending Physician: Dorothy Spark [[id number]]  PT Class (Do Not Modify): Observation [104]  PT Acc Code (Do Not Modify): Observation [10022]       B Medical/Surgery History Past Medical History:  Diagnosis Date  . Chest pain   . Chest tightness 04/06/2011  . Dysesthesia 10/19/2016  . Heart murmur   . History of cervical spinal surgery 10/19/2016  . History of lumbosacral spine surgery 10/19/2016  . Hyperlipidemia   . Indigestion   . Normal echocardiogram 05/31/05   normal LV function; trace MR;trace tricuspid regurgitation  . Normal  nuclear stress test 07/26/02   no evidence of ischemia; nml LVF  . Polyneuropathy 10/19/2016  . Right bundle branch block   . Trace mitral regurgitation by prior echocardiogram   . Trace tricuspid regurgitation by prior echocardiogram   . Unstable angina ([place]) 03/14/2019  . Upper airway cough syndrome 09/12/2014   Followed in Pulmonary clinic/ [facility]/ [facility]  - gerd rx 09/12/2014 >>>    Past Surgical History:  Procedure Laterality Date  . SPINE SURGERY     x 2      A IV Location/Drains/Wounds Patient Lines/Drains/Airways Status   Active Line/Drains/Airways    Name:   Placement date:   Placement time:   Site:   Days:   Peripheral IV 03/14/19 Left Antecubital   03/14/19    1104    Antecubital   less than 1   Peripheral IV 03/14/19 Right Antecubital   03/14/19    1108    Antecubital   less than 1          Intake/Output Last 24 hours No intake or output data in the 24 hours ending 03/14/19 1438  Labs/Imaging Results for orders placed or performed during the hospital encounter of 03/14/19 (from the past 48 hour(s))  Basic metabolic panel     Status: Abnormal   Collection Time: 03/14/19  8:47 AM  Result Value Ref Range   Sodium 140 135 - 145 mmol/L   Potassium 4.3 3.5 - 5.1 mmol/L   Chloride  108 98 - 111 mmol/L   CO2 22 22 - 32 mmol/L   Glucose, Bld 110 (H) 70 - 99 mg/dL   BUN 13 8 - 23 mg/dL   Creatinine, Ser 0.90 0.61 - 1.24 mg/dL   Calcium 8.8 (L) 8.9 - 10.3 mg/dL   GFR calc non Af Amer >60 >60 mL/min   GFR calc Af Amer >60 >60 mL/min   Anion gap 10 5 - 15    Comment: Performed at [facility] [address] [place], [place] [address]  CBC     Status: Abnormal   Collection Time: 03/14/19  8:47 AM  Result Value Ref Range   WBC 7.7 4.0 - 10.5 K/uL   RBC 4.12 (L) 4.22 - 5.81 MIL/uL   Hemoglobin 12.5 (L) 13.0 - 17.0 g/dL   HCT 36.7 (L) 39.0 - 52.0 %   MCV 89.1 80.0 - 100.0 fL   MCH 30.3 26.0 - 34.0 pg   MCHC 34.1 30.0 - 36.0 g/dL   RDW 12.3 11.5 -  15.5 %   Platelets 258 150 - 400 K/uL   nRBC 0.0 0.0 - 0.2 %    Comment: Performed at [facility], [place] [address] [place], [place] [address]  Troponin I - Once     Status: Abnormal   Collection Time: 03/14/19  8:47 AM  Result Value Ref Range   Troponin I 0.23 (HH) <0.03 ng/mL    Comment: CRITICAL RESULT CALLED TO, READ BACK BY AND VERIFIED WITH: Vasilios Ottaway,RN AT 0981 03/14/2019 BY ZBEECH. Performed at [facility], [place] [address], [place], [place] [address]   Lipid panel     Status: Abnormal   Collection Time: 03/14/19  8:47 AM  Result Value Ref Range   Cholesterol 168 0 - 200 mg/dL   Triglycerides 217 (H) <150 mg/dL   HDL 30 (L) >40 mg/dL   Total CHOL/HDL Ratio 5.6 RATIO   VLDL 43 (H) 0 - 40 mg/dL   LDL Cholesterol 95 0 - 99 mg/dL    Comment:        Total Cholesterol/HDL:CHD Risk Coronary Heart Disease Risk Table                     Men   Women  1/2 Average Risk   3.4   3.3  Average Risk       5.0   4.4  2 X Average Risk   9.6   7.1  3 X Average Risk  23.4   11.0        Use the calculated Patient Ratio above and the CHD Risk Table to determine the patient's CHD Risk.        ATP III CLASSIFICATION (LDL):  <100     mg/dL   Optimal  100-129  mg/dL   Near or Above                    Optimal  130-159  mg/dL   Borderline  160-189  mg/dL   High  >190     mg/dL   Very High Performed at [facility] [address] [place], [place] [address]   TSH     Status: None   Collection Time: 03/14/19 10:20 AM  Result Value Ref Range   TSH 4.319 0.350 - 4.500 uIU/mL    Comment: Performed by a 3rd Generation assay with a functional sensitivity of <=0.01 uIU/mL. Performed at [facility], [place] [address], [place], [place] [address]    No results found.  Pending Labs Unresulted Labs (From admission, onward)    Start     Ordered   03/15/19 0500  Comprehensive metabolic panel  Tomorrow morning,   R     03/14/19 1416   03/14/19 1400  Troponin I - Now  Then Q6H  Now then every 6 hours,   R     03/14/19 1004   Unscheduled  Occult blood card to lab, stool  As needed,   R     03/14/19 1416          Vitals/Pain Today's Vitals   03/14/19 1330 03/14/19 1340 03/14/19 1400 03/14/19 1415  BP: 117/67  120/61 129/78  Pulse: 60  (!) 56 (!) 56  Resp: 16  (!) 23 15  Temp:      TempSrc:      SpO2: 94%  95% 95%  Weight:      Height:      PainSc:  0-No pain      Isolation Precautions No active isolations  Medications Medications  nitroGLYCERIN (NITROSTAT) SL tablet 0.4 mg (has no administration in time range)  acetaminophen (TYLENOL) tablet 650 mg (has no administration in time range)  ondansetron (ZOFRAN) injection 4 mg (has no administration in time range)  zolpidem (AMBIEN) tablet 5 mg (has no administration in time range)  ALPRAZolam (XANAX) tablet 0.25 mg (has no administration in time range)  sodium chloride flush (NS) 0.9 % injection 3 mL (3 mLs Intravenous Not Given 03/14/19 1434)  sodium chloride flush (NS) 0.9 % injection 3 mL (has no administration in time range)  0.9 %  sodium chloride infusion (has no administration in time range)  0.9 %  sodium chloride infusion (has no administration in time range)  0.9% sodium chloride infusion (3 mL/kg/hr  76.7 kg Intravenous Not Given 03/14/19 1144)    Followed by  0.9% sodium chloride infusion (1 mL/kg/hr  76.7 kg Intravenous New Bag/Given 03/14/19 1434)  sodium chloride flush (NS) 0.9 % injection 3 mL (3 mLs Intravenous Not Given 03/14/19 1434)  sodium chloride flush (NS) 0.9 % injection 3 mL (has no administration in time range)  nitroGLYCERIN 50 mg in dextrose 5 % 250 mL (0.2 mg/mL) infusion (5 mcg/min Intravenous New Bag/Given 03/14/19 1105)    Mobility walks Low fall risk   Focused Assessments Cardiac Assessment Handoff:  Cardiac Rhythm: Normal sinus rhythm, Sinus bradycardia Lab Results  Component Value Date   TROPONINI 0.23 ([place]) 03/14/2019   No results found for:  DDIMER Does the Patient currently have chest pain? No     R Recommendations: See Admitting Provider Note  Report given to:   Additional Notes:

## 2019-03-14 NOTE — Interval H&P Note (Signed)
Cath Lab Visit (complete for each Cath Lab visit)  Clinical Evaluation Leading to the Procedure:   ACS: Yes.    Non-ACS:    Anginal Classification: CCS IV  Anti-ischemic medical therapy: Maximal Therapy (2 or more classes of medications)  Non-Invasive Test Results: Low-risk stress test findings: cardiac mortality <1%/year  Prior CABG: No previous CABG      History and Physical Interval Note:  03/14/2019 4:41 PM  Mark Garner  has presented today for surgery, with the diagnosis of angina.  The various methods of treatment have been discussed with the patient and family. After consideration of risks, benefits and other options for treatment, the patient has consented to  Procedure(s): LEFT HEART CATH AND CORONARY ANGIOGRAPHY (N/A) as a surgical intervention.  The patient's history has been reviewed, patient examined, no change in status, stable for surgery.  I have reviewed the patient's chart and labs.  Questions were answered to the patient's satisfaction.     Mark Garner

## 2019-03-14 NOTE — H&P (Addendum)
Cardiology Admission History and Physical:   Patient ID: Mark Garner; MRN: [id number]; DOB: Feb 10, 1946   Admission date: 03/14/2019  Primary Care Provider: Lujean Amel, MD Primary Cardiologist: Mertie Moores, MD 03/12/2019 Primary Electrophysiologist:  None   Chief Complaint:  Chest pain  Patient Profile:   Mark Garner is a 73 y.o. male with a history of RBBB, cp w/ nl MV 02/08/2019, HLD, neuropathy, GERD.   History of Present Illness:   Mark Garner had a video visit with Dr Acie Fredrickson 05/26 for CP in follow up of the MV (ordered based on phone notes 05/05) and irreg HR. Monitor ordered.  06/08 virtual visit, he had sudden fatigue after a bike ride, sx concerning for angina, cath ordered.   Pt had chest pain this am, it comes up the sides of his neck and goes to both sides of his jaw. 6/10. This is different from the sx he was having before the stress test. Since the stress test, he has had these sx intermittently, sometimes lasting all day. He would get these sx with exertion. The shoulder and arm pain was random, not exertional.   His jaw was tight when he got up this am, felt general malaise. Neck and chest started tightening. He started taking nitro. Took 3 total, but got light-headed and pulse was weak. Took 4 baby ASA. EMS transported, sx were improving.  Right now, neck feels stiff, jaw has a little stiffness and has some general malaise.    Past Medical History:  Diagnosis Date  . Chest pain   . Chest tightness 04/06/2011  . Dysesthesia 10/19/2016  . Heart murmur   . History of cervical spinal surgery 10/19/2016  . History of lumbosacral spine surgery 10/19/2016  . Hyperlipidemia   . Indigestion   . Normal echocardiogram 05/31/05   normal LV function; trace MR;trace tricuspid regurgitation  . Normal nuclear stress test 07/26/02   no evidence of ischemia; nml LVF  . Polyneuropathy 10/19/2016  . Right bundle branch block   . Trace mitral regurgitation by prior  echocardiogram   . Trace tricuspid regurgitation by prior echocardiogram   . Unstable angina ([place]) 03/14/2019  . Upper airway cough syndrome 09/12/2014   Followed in Pulmonary clinic/ [facility]/ [facility]  - gerd rx 09/12/2014 >>>     Past Surgical History:  Procedure Laterality Date  . SPINE SURGERY     x 2      Medications Prior to Admission: Prior to Admission medications   Medication Sig Start Date End Date Taking? Authorizing Provider  acetaminophen (TYLENOL) 500 MG tablet Take 500 mg by mouth every 6 (six) hours as needed (pain).    Yes [provider]  aspirin EC 81 MG tablet Take 1 tablet (81 mg total) by mouth daily. 03/12/19  Yes Nahser, Wonda Cheng, MD  famotidine (PEPCID) 20 MG tablet Take 20 mg by mouth daily.   Yes [provider]  isosorbide mononitrate (IMDUR) 30 MG 24 hr tablet Take 1 tablet (30 mg total) by mouth daily. 03/12/19 06/10/19 Yes Nahser, Wonda Cheng, MD  lamoTRIgine (LAMICTAL) 100 MG tablet Take 0.5 tablets (50 mg total) by mouth 2 (two) times daily. 10/31/18  Yes Sater, Nanine Means, MD  metoprolol tartrate (LOPRESSOR) 25 MG tablet Take 0.5 tablets (12.5 mg total) by mouth 2 (two) times daily. 03/12/19  Yes Nahser, Wonda Cheng, MD  nitroGLYCERIN (NITROSTAT) 0.4 MG SL tablet Place 1 tablet (0.4 mg total) under the tongue every 5 (five) minutes as needed  for chest pain. 03/12/19  Yes Nahser, Wonda Cheng, MD  Omega-3 Fatty Acids (FISH OIL) 1000 MG CAPS Take 2,000 mg by mouth 2 (two) times a day.   Yes [provider]  Simethicone (GAS-X EXTRA STRENGTH PO) Take 1 capsule by mouth 4 (four) times daily as needed (gas).    Yes [provider]     Allergies:    Allergies  Allergen Reactions  . Fenofibrate Other (See Comments)    Leg pain  . Gemfibrozil Other (See Comments)    Leg pain  . Niacin And Related     Severe flushing    Social History:   Social History   Socioeconomic History  . Marital status: Married    Spouse name: Not on file   . Number of children: Not on file  . Years of education: Not on file  . Highest education level: Not on file  Occupational History  . Occupation: Retired  Scientific laboratory technician  . Financial resource strain: Not on file  . Food insecurity:    Worry: Not on file    Inability: Not on file  . Transportation needs:    Medical: Not on file    Non-medical: Not on file  Tobacco Use  . Smoking status: Former Smoker    Packs/day: 1.00    Years: 5.00    Pack years: 5.00    Types: Cigarettes    Last attempt to quit: 10/04/1984    Years since quitting: 34.4  . Smokeless tobacco: Never Used  Substance and Sexual Activity  . Alcohol use: No    Alcohol/week: 0.0 standard drinks  . Drug use: No  . Sexual activity: Not on file  Lifestyle  . Physical activity:    Days per week: Not on file    Minutes per session: Not on file  . Stress: Not on file  Relationships  . Social connections:    Talks on phone: Not on file    Gets together: Not on file    Attends religious service: Not on file    Active member of club or organization: Not on file    Attends meetings of clubs or organizations: Not on file    Relationship status: Not on file  . Intimate partner violence:    Fear of current or ex partner: Not on file    Emotionally abused: Not on file    Physically abused: Not on file    Forced sexual activity: Not on file  Other Topics Concern  . Not on file  Social History Narrative  . Not on file    Family History:   The patient's family history includes Heart disease (age of onset: 28) in his brother.   The patient He indicated that his father is deceased. He indicated that his sister is alive. He indicated that both of his brothers are alive.    ROS:  Please see the history of present illness. GERD is controlled w/ rx. All other ROS reviewed and negative.     Physical Exam/Data:   Vitals:   03/14/19 0800 03/14/19 0830 03/14/19 0900 03/14/19 0930  BP: (!) 137/96 (!) 129/93 (!) 163/86 (!)  158/79  Pulse: 67 (!) 52 66 (!) 59  Resp: 13 10 18 14   Temp:      TempSrc:      SpO2: 95% 96% 100% 96%  Weight:      Height:       No intake or output data in the 24 hours  ending 03/14/19 1000 Filed Weights   03/14/19 0755  Weight: 76.7 kg   Body mass index is 24.96 kg/m.  General:  Well nourished, well developed, in no acute distress HEENT: normal Lymph: no adenopathy Neck:  JVD not elevated Endocrine:  No thryomegaly Vascular: No carotid bruits; 4/4 extremithy pulses 2+ bilaterally  Cardiac:  normal S1, S2; RRR; no murmur, no rub or gallop  Lungs:  clear to auscultation bilaterally, no wheezing, rhonchi or rales  Abd: soft, nontender, no hepatomegaly  Ext: no edema Musculoskeletal:  No deformities, BUE and BLE strength normal and equal Skin: warm and dry  Neuro:  CNs 2-12 intact, no focal abnormalities noted Psych:  Normal affect    EKG:  The ECG that was done 06/10 was personally reviewed and demonstrates SR, RBBB is old, no new changes  Relevant CV Studies:  ECHO: will order  CATH: will schedule  Laboratory Data:  Chemistry Recent Labs  Lab 03/12/19 1414  NA 139  K 4.2  CL 105  CO2 20  GLUCOSE 148*  BUN 13  CREATININE 0.91  CALCIUM 9.1  GFRNONAA 83  GFRAA 96    No results for input(s): PROT, ALBUMIN, AST, ALT, ALKPHOS, BILITOT in the last 168 hours. Hematology Recent Labs  Lab 03/12/19 1414 03/14/19 0847  WBC 6.4 7.7  RBC 4.37 4.12*  HGB 13.4 12.5*  HCT 38.1 36.7*  MCV 87 89.1  MCH 30.7 30.3  MCHC 35.2 34.1  RDW 13.2 12.3  PLT 301 258   Lab Results  Component Value Date   CHOL 194 12/24/2014   HDL 33.20 (L) 12/24/2014   LDLCALC 121 (H) 12/24/2014   LDLDIRECT 80.7 06/14/2014   TRIG 198.0 (H) 12/24/2014   CHOLHDL 6 12/24/2014   No results found for: CKTOTAL, CKMB, CKMBINDEX, TROPONINI   Radiology/Studies:  No results found.  Assessment and Plan:   1. Unstable angina - admit, cath today, ck echo - add IV Nitro and heparin -  currently stable and pain-free - Pt states Dr Acie Fredrickson discussed the risks/benefits of the procedure, he has no questions.   2. HLD - cannot take statins - once cath confirms CAD, goal LDL will be <70 - start process to do Repatha  3. PAF, NSVT - 1 episode PAF seen on monitor, sustained, RVR, total duration not listed - 7 bt run NSVT also seen - pt has hx palpitations, unclear what is causing them - he has had some prolonged palpitations, may be from Afib - current CHA2DS2-VASc = 2 (age x 1, HTN), will increase to 3 if +CAD, will go up another point when he hits 73. - discuss systemic anticoagulation w/ MD.  Principal Problem:   Unstable angina ([place]) Active Problems:   Hyperlipidemia   For questions or updates, please contact [facility] Please consult [email or web address] for contact info under Cardiology/STEMI.    Signed, Rosaria Ferries, PA-C  03/14/2019 10:00 AM   The patient was seen, examined and discussed with Rosaria Ferries, PA-C and I agree with the above.   A very pleasant 73 year old male with strong FH of CAD, with worsening typical angina in the last 2 months, normal stress test a month ago, mildly elevated troponin on 6/8 at 0.040, now with unstable angina from exertional to resting radiating to his jaw. His labs including troponin are pending, crea normal on 6/8.  Physical exam shows no carotid bruit, S1,2 no murmur, clear lungs B/L, no LE edema and good peripheral pulses.  He has known  hyperlipidemia but didn't tolerate statins in the past, we will arrange for outpatient referral for PCSK 9 inhibitors.  We will start heparin drip and imdur drip for unstable angina and hypertension and schedule for a cath today.   Ena Dawley, MD 03/14/2019   Ena Dawley, MD 03/14/2019

## 2019-03-14 NOTE — Telephone Encounter (Signed)
New message   Patient states that he is in the er and he is having chest discomfort. Please call the patient. He is in the er on today. He wants to discuss the cardiac cath on tomorrow.

## 2019-03-14 NOTE — Telephone Encounter (Addendum)
Pt says he woke up and had a "funny feeling" In his chest and felt best to go to he ER.Marland Kitchen He says they have ruled out MI but are going to talk with Cardio about possibly doing his heart cath originally scheduled for tomorrow.. today, since he has been fasting and their may be an opening on the cath schedule... I wished him good thoughts and advised him I will forward to Dr. Acie Fredrickson... and we look  forward to seeing him at his post hosp visit and to call if he needs anything prior to then.

## 2019-03-14 NOTE — ED Provider Notes (Signed)
[facility] EMERGENCY DEPARTMENT Provider Note   CSN: [id number] Arrival date & time: 03/14/19  0749    History   Chief Complaint Chief Complaint  Patient presents with  . Jaw Pain    HPI Mark Garner is a 73 y.o. male.     73yo M w/ PMH including HLD, RBB who p/w jaw pain. Pt reports that a few weeks ago while he was bike riding for exercise, he suddenly felt extremely fatigued.  He contacted his cardiologist, Dr. Acie Fredrickson, who placed him on a heart monitor.  He had another weird episode a few days ago and was told that he had a run of atrial fibrillation, and then he was called 2 days ago to be notified that he had a run of ventricular tachycardia 3 days ago.  Dr. Acie Fredrickson set him up for a cardiac catherization tomorrow pending COVID-19 testing.  This morning around 530 to 6 AM, he began having left-sided jaw pain that was mild but persistent.  When it would go away, he contacted his cardiologist and was told to take 3 nitroglycerin.  He continued to feel strange and was diaphoretic so he called EMS.  Currently, he denies any complaints of pain including no chest pain or shortness of breath.  He denies any fevers or recent illness.  He last ate last night and had water this morning with his morning medications.  The history is provided by the patient.    Past Medical History:  Diagnosis Date  . Chest pain   . Chest tightness 04/06/2011  . Dysesthesia 10/19/2016  . Heart murmur   . History of cervical spinal surgery 10/19/2016  . History of lumbosacral spine surgery 10/19/2016  . Hyperlipidemia   . Indigestion   . Normal echocardiogram 05/31/05   normal LV function; trace MR;trace tricuspid regurgitation  . Normal nuclear stress test 07/26/02   no evidence of ischemia; nml LVF  . Polyneuropathy 10/19/2016  . Right bundle branch block   . Trace mitral regurgitation by prior echocardiogram   . Trace tricuspid regurgitation by prior echocardiogram   . Unstable  angina ([place]) 03/14/2019  . Upper airway cough syndrome 09/12/2014   Followed in Pulmonary clinic/ [facility]/ [facility]  - gerd rx 09/12/2014 >>>     Patient Active Problem List   Diagnosis Date Noted  . Unstable angina ([place]) 03/14/2019  . Palpitations 02/27/2019  . Hearing loss 10/31/2018  . Polyneuropathy 10/19/2016  . Dysesthesia 10/19/2016  . History of cervical spinal surgery 10/19/2016  . History of lumbosacral spine surgery 10/19/2016  . Upper airway cough syndrome 09/12/2014  . RBBB 06/11/2014  . Hyperlipidemia 06/11/2014  . Chest tightness 04/06/2011    Past Surgical History:  Procedure Laterality Date  . SPINE SURGERY     x 2         Home Medications    Prior to Admission medications   Medication Sig Start Date End Date Taking? Authorizing Provider  acetaminophen (TYLENOL) 500 MG tablet Take 500 mg by mouth every 6 (six) hours as needed (pain).    Yes [provider]  aspirin EC 81 MG tablet Take 1 tablet (81 mg total) by mouth daily. 03/12/19  Yes Nahser, Wonda Cheng, MD  famotidine (PEPCID) 20 MG tablet Take 20 mg by mouth daily.   Yes [provider]  isosorbide mononitrate (IMDUR) 30 MG 24 hr tablet Take 1 tablet (30 mg total) by mouth daily. 03/12/19 06/10/19 Yes Nahser, Wonda Cheng, MD  lamoTRIgine (LAMICTAL) 100 MG tablet Take 0.5 tablets (50 mg total) by mouth 2 (two) times daily. 10/31/18  Yes Sater, Nanine Means, MD  metoprolol tartrate (LOPRESSOR) 25 MG tablet Take 0.5 tablets (12.5 mg total) by mouth 2 (two) times daily. 03/12/19  Yes Nahser, Wonda Cheng, MD  nitroGLYCERIN (NITROSTAT) 0.4 MG SL tablet Place 1 tablet (0.4 mg total) under the tongue every 5 (five) minutes as needed for chest pain. 03/12/19  Yes Nahser, Wonda Cheng, MD  Omega-3 Fatty Acids (FISH OIL) 1000 MG CAPS Take 2,000 mg by mouth 2 (two) times a day.   Yes [provider]  Simethicone (GAS-X EXTRA STRENGTH PO) Take 1 capsule by mouth 4 (four) times daily as needed (gas).    Yes  [provider]    Family History Family History  Problem Relation Age of Onset  . Heart disease Brother 68       MI/ptca    Social History Social History   Tobacco Use  . Smoking status: Former Smoker    Packs/day: 1.00    Years: 5.00    Pack years: 5.00    Types: Cigarettes    Last attempt to quit: 10/04/1984    Years since quitting: 34.4  . Smokeless tobacco: Never Used  Substance Use Topics  . Alcohol use: No    Alcohol/week: 0.0 standard drinks  . Drug use: No     Allergies   Fenofibrate; Gemfibrozil; and Niacin and related   Review of Systems Review of Systems All other systems reviewed and are negative except that which was mentioned in HPI   Physical Exam Updated Vital Signs BP (!) 158/79   Pulse (!) 59   Temp 98.4 F (36.9 C) (Oral)   Resp 14   Ht 5\' 9"  (1.753 m)   Wt 76.7 kg   SpO2 96%   BMI 24.96 kg/m   Physical Exam Vitals signs and nursing note reviewed.  Constitutional:      General: He is not in acute distress.    Appearance: He is well-developed.  HENT:     Head: Normocephalic and atraumatic.  Eyes:     Conjunctiva/sclera: Conjunctivae normal.     Pupils: Pupils are equal, round, and reactive to light.  Neck:     Musculoskeletal: Neck supple.  Cardiovascular:     Rate and Rhythm: Regular rhythm. Bradycardia present.     Heart sounds: Normal heart sounds. No murmur.  Pulmonary:     Effort: Pulmonary effort is normal.     Breath sounds: Normal breath sounds.  Abdominal:     General: Bowel sounds are normal. There is no distension.     Palpations: Abdomen is soft.     Tenderness: There is no abdominal tenderness.  Skin:    General: Skin is warm and dry.  Neurological:     Mental Status: He is alert and oriented to person, place, and time.     Comments: Fluent speech  Psychiatric:        Judgment: Judgment normal.      ED Treatments / Results  Labs (all labs ordered are listed, but only abnormal results are  displayed) Labs Reviewed  CBC - Abnormal; Notable for the following components:      Result Value   RBC 4.12 (*)    Hemoglobin 12.5 (*)    HCT 36.7 (*)    All other components within normal limits  BASIC METABOLIC PANEL  TROPONIN I  LIPID PANEL  TSH  TROPONIN  I  TROPONIN I    EKG EKG Interpretation  Date/Time:  Wednesday March 14 2019 07:53:21 EDT Ventricular Rate:  57 PR Interval:    QRS Duration: 141 QT Interval:  455 QTC Calculation: 443 R Axis:   90 Text Interpretation:  Sinus rhythm RBBB and LPFB No significant change since last tracing Confirmed by Theotis Burrow [phone]) on 03/14/2019 8:13:30 AM   Radiology No results found.  Procedures Procedures (including critical care time)  Medications Ordered in ED Medications  0.9 %  sodium chloride infusion (has no administration in time range)  0.9% sodium chloride infusion (has no administration in time range)    Followed by  0.9% sodium chloride infusion (has no administration in time range)  sodium chloride flush (NS) 0.9 % injection 3 mL (has no administration in time range)  sodium chloride flush (NS) 0.9 % injection 3 mL (has no administration in time range)     Initial Impression / Assessment and Plan / ED Course  I have reviewed the triage vital signs and the nursing notes.  Pertinent labs & imaging results that were available during my care of the patient were reviewed by me and considered in my medical decision making (see chart for details).        Well-appearing and comfortable on exam, pain-free.  EKG shows no ischemic changes, RBBB similar to previous.  Contacted cardiology given his upcoming catheterization tomorrow.  His COVID-19 testing is negative.  I have ordered screening labs and troponin. Discussed w/ cardiology, Suanne Marker, and they will admit for likely catherization. Final Clinical Impressions(s) / ED Diagnoses   Final diagnoses:  Unstable angina [facility])    ED Discharge Orders    None        Ithzel Fedorchak, Wenda Overland, MD 03/14/19 1011

## 2019-03-14 NOTE — Telephone Encounter (Signed)
Pt is now in the ER.   Ive talked to him. He will hopefully get cathed today

## 2019-03-14 NOTE — Telephone Encounter (Signed)
LMTCB

## 2019-03-14 NOTE — Progress Notes (Signed)
ANTICOAGULATION CONSULT NOTE - Initial Consult  Pharmacy Consult for heparin Indication: chest pain/ACS  Allergies  Allergen Reactions  . Fenofibrate Other (See Comments)    Leg pain  . Gemfibrozil Other (See Comments)    Leg pain  . Niacin And Related     Severe flushing    Patient Measurements: Height: 5\' 9"  (175.3 cm) Weight: 169 lb (76.7 kg) IBW/kg (Calculated) : 70.7 Heparin Dosing Weight: 76.7kg  Vital Signs: Temp: 98.4 F (36.9 C) (06/10 0754) Temp Source: Oral (06/10 0754) BP: 129/78 (06/10 1415) Pulse Rate: 56 (06/10 1415)  Labs: Recent Labs    03/12/19 1414 03/14/19 0847  HGB 13.4 12.5*  HCT 38.1 36.7*  PLT 301 258  CREATININE 0.91 0.90  TROPONINI  --  0.23*    Estimated Creatinine Clearance: 72 mL/min (by C-G formula based on SCr of 0.9 mg/dL).   Medical History: Past Medical History:  Diagnosis Date  . Chest pain   . Chest tightness 04/06/2011  . Dysesthesia 10/19/2016  . Heart murmur   . History of cervical spinal surgery 10/19/2016  . History of lumbosacral spine surgery 10/19/2016  . Hyperlipidemia   . Indigestion   . Normal echocardiogram 05/31/05   normal LV function; trace MR;trace tricuspid regurgitation  . Normal nuclear stress test 07/26/02   no evidence of ischemia; nml LVF  . Polyneuropathy 10/19/2016  . Right bundle branch block   . Trace mitral regurgitation by prior echocardiogram   . Trace tricuspid regurgitation by prior echocardiogram   . Unstable angina ([place]) 03/14/2019  . Upper airway cough syndrome 09/12/2014   Followed in Pulmonary clinic/ [facility]/ [facility]  - gerd rx 09/12/2014 >>>     Medications:  Infusions:  . sodium chloride    . sodium chloride    . sodium chloride 1 mL/kg/hr (03/14/19 1434)  . heparin    . nitroGLYCERIN 5 mcg/min (03/14/19 1105)    Assessment: 73 yom presented to the ED with CP. Troponin elevated and now starting IV heparin. Baseline CBC is ok and he is not on anticoagulation PTA.    Goal of Therapy:  Heparin level 0.3-0.7 units/ml Monitor platelets by anticoagulation protocol: Yes   Plan:  Heparin bolus 4000 units IV x 1 Heparin gtt 950 units/hr Check an 8 hr heparin level Daily heparin level and CBC  Olumide Dolinger, Rande Lawman 03/14/2019,2:40 PM

## 2019-03-15 ENCOUNTER — Ambulatory Visit (HOSPITAL_COMMUNITY): Admission: RE | Admit: 2019-03-15 | Payer: Medicare Other | Source: Home / Self Care | Admitting: Cardiovascular Disease

## 2019-03-15 ENCOUNTER — Observation Stay (HOSPITAL_COMMUNITY): Payer: Medicare Other

## 2019-03-15 ENCOUNTER — Encounter (HOSPITAL_COMMUNITY): Payer: Self-pay | Admitting: Cardiovascular Disease

## 2019-03-15 ENCOUNTER — Other Ambulatory Visit: Payer: Self-pay | Admitting: Physician Assistant

## 2019-03-15 DIAGNOSIS — R079 Chest pain, unspecified: Secondary | ICD-10-CM | POA: Diagnosis not present

## 2019-03-15 DIAGNOSIS — Z7982 Long term (current) use of aspirin: Secondary | ICD-10-CM | POA: Diagnosis not present

## 2019-03-15 DIAGNOSIS — R6884 Jaw pain: Secondary | ICD-10-CM | POA: Diagnosis present

## 2019-03-15 DIAGNOSIS — Z8249 Family history of ischemic heart disease and other diseases of the circulatory system: Secondary | ICD-10-CM | POA: Diagnosis not present

## 2019-03-15 DIAGNOSIS — E785 Hyperlipidemia, unspecified: Secondary | ICD-10-CM | POA: Diagnosis present

## 2019-03-15 DIAGNOSIS — I214 Non-ST elevation (NSTEMI) myocardial infarction: Principal | ICD-10-CM | POA: Diagnosis present

## 2019-03-15 DIAGNOSIS — I480 Paroxysmal atrial fibrillation: Secondary | ICD-10-CM | POA: Diagnosis present

## 2019-03-15 DIAGNOSIS — E782 Mixed hyperlipidemia: Secondary | ICD-10-CM | POA: Diagnosis not present

## 2019-03-15 DIAGNOSIS — I200 Unstable angina: Secondary | ICD-10-CM | POA: Diagnosis not present

## 2019-03-15 DIAGNOSIS — G629 Polyneuropathy, unspecified: Secondary | ICD-10-CM | POA: Diagnosis present

## 2019-03-15 DIAGNOSIS — Z20828 Contact with and (suspected) exposure to other viral communicable diseases: Secondary | ICD-10-CM | POA: Diagnosis present

## 2019-03-15 DIAGNOSIS — I10 Essential (primary) hypertension: Secondary | ICD-10-CM | POA: Diagnosis not present

## 2019-03-15 DIAGNOSIS — Z888 Allergy status to other drugs, medicaments and biological substances status: Secondary | ICD-10-CM | POA: Diagnosis not present

## 2019-03-15 DIAGNOSIS — I472 Ventricular tachycardia: Secondary | ICD-10-CM | POA: Diagnosis present

## 2019-03-15 DIAGNOSIS — I4510 Unspecified right bundle-branch block: Secondary | ICD-10-CM | POA: Diagnosis present

## 2019-03-15 DIAGNOSIS — Z87891 Personal history of nicotine dependence: Secondary | ICD-10-CM | POA: Diagnosis not present

## 2019-03-15 DIAGNOSIS — K219 Gastro-esophageal reflux disease without esophagitis: Secondary | ICD-10-CM | POA: Diagnosis present

## 2019-03-15 DIAGNOSIS — I25110 Atherosclerotic heart disease of native coronary artery with unstable angina pectoris: Secondary | ICD-10-CM | POA: Diagnosis present

## 2019-03-15 HISTORY — DX: Non-ST elevation (NSTEMI) myocardial infarction: I21.4

## 2019-03-15 LAB — COMPREHENSIVE METABOLIC PANEL
ALT: 22 U/L (ref 0–44)
AST: 20 U/L (ref 15–41)
Albumin: 3.3 g/dL — ABNORMAL LOW (ref 3.5–5.0)
Alkaline Phosphatase: 40 U/L (ref 38–126)
Anion gap: 11 (ref 5–15)
BUN: 13 mg/dL (ref 8–23)
CO2: 20 mmol/L — ABNORMAL LOW (ref 22–32)
Calcium: 8.4 mg/dL — ABNORMAL LOW (ref 8.9–10.3)
Chloride: 107 mmol/L (ref 98–111)
Creatinine, Ser: 0.9 mg/dL (ref 0.61–1.24)
GFR calc Af Amer: >60 mL/min (ref >60)
GFR calc non Af Amer: >60 mL/min (ref >60)
Glucose, Bld: 85 mg/dL (ref 70–99)
Potassium: 3.8 mmol/L (ref 3.5–5.1)
Sodium: 138 mmol/L (ref 135–145)
Total Bilirubin: 0.7 mg/dL (ref 0.3–1.2)
Total Protein: 5.8 g/dL — ABNORMAL LOW (ref 6.5–8.1)

## 2019-03-15 LAB — CBC
HCT: 33.3 % — ABNORMAL LOW (ref 39.0–52.0)
Hemoglobin: 11.9 g/dL — ABNORMAL LOW (ref 13.0–17.0)
MCH: 30.8 pg (ref 26.0–34.0)
MCHC: 35.7 g/dL (ref 30.0–36.0)
MCV: 86.3 fL (ref 80.0–100.0)
Platelets: 245 10*3/uL (ref 150–400)
RBC: 3.86 MIL/uL — ABNORMAL LOW (ref 4.22–5.81)
RDW: 12.2 % (ref 11.5–15.5)
WBC: 9.5 10*3/uL (ref 4.0–10.5)
nRBC: 0 % (ref 0.0–0.2)

## 2019-03-15 LAB — ECHOCARDIOGRAM COMPLETE
Height: 69 in
Weight: 2684.32 oz

## 2019-03-15 LAB — TROPONIN I: Troponin I: 0.83 ng/mL (ref <0.03)

## 2019-03-15 MED ORDER — TICAGRELOR 90 MG PO TABS: 90.0000 mg | ORAL_TABLET | Freq: Two times a day (BID) | ORAL | 11 refills | Status: DC

## 2019-03-15 MED ORDER — TICAGRELOR 90 MG PO TABS: 90.0000 mg | ORAL_TABLET | Freq: Two times a day (BID) | ORAL | 0 refills | Status: DC

## 2019-03-15 NOTE — Plan of Care (Signed)
  Problem: Cardiovascular: Goal: Vascular access site(s) Level 0-1 will be maintained Outcome: Progressing Note:  Right radial site remains level 0.

## 2019-03-15 NOTE — Progress Notes (Signed)
2D Echocardiogram has been performed.  Mark Garner 03/15/2019, 10:27 AM

## 2019-03-15 NOTE — TOC Benefit Eligibility Note (Signed)
Transition of Care [facility]) Benefit Eligibility Note    Patient Details  Name: Mark Garner MRN: [id number] Date of Birth: 1945/12/28   Medication/Dose: B RILINTA  90 MG BID  Covered?: Yes  Tier: 3 Drug  Prescription Coverage Preferred Pharmacy: [facility] with Person/Company/Phone Number:: SIDNEY  @ [place] [employer] # [phone]  Co-Pay: $47.00  Prior Approval: No  Deductible: Unmet       Memory Argue Phone Number: 03/15/2019, 12:45 PM

## 2019-03-15 NOTE — Progress Notes (Signed)
CARDIAC REHAB PHASE I   PRE:  Rate/Rhythm: 71 SR  BP:  Supine:   Sitting: 143/77  Standing:    SaO2: 97%RA  MODE:  Ambulation: 470 ft   POST:  Rate/Rhythm: 95 SR  BP:  Supine:   Sitting: 147/87  Standing:    SaO2: 96%RA 0812-0907 Pt walked 470 ft on RA with steady gait and no CP. Tolerated well. Reviewed NTG use, importance of brilinta with stent, walking for ex, heart healthy food choices, and CRP 2. Referred to [place] CRP 2.  Pt voiced understanding of ed.    Graylon Good, RN BSN  03/15/2019 9:02 AM

## 2019-03-15 NOTE — Discharge Instructions (Signed)
PLEASE REMEMBER TO BRING ALL OF YOUR MEDICATIONS TO EACH OF YOUR FOLLOW-UP OFFICE VISITS.  PLEASE ATTEND ALL SCHEDULED FOLLOW-UP APPOINTMENTS.   Activity: Increase activity slowly as tolerated. You may shower, but no soaking baths (or swimming) for 1 week. No driving for 1 week. No lifting over 5 lbs for 2 weeks. No sexual activity for 1 week.   You May Return to Work: in 3 weeks (if applicable)  Wound Care: You may wash cath site gently with soap and water. Keep cath site clean and dry. If you notice pain, swelling, bleeding or pus at your cath site, please call [phone].    Cardiac Cath Site Care Refer to this sheet in the next few weeks. These instructions provide you with information on caring for yourself after your procedure. Your caregiver may also give you more specific instructions. Your treatment has been planned according to current medical practices, but problems sometimes occur. Call your caregiver if you have any problems or questions after your procedure. HOME CARE INSTRUCTIONS  You may shower 24 hours after the procedure. Remove the bandage (dressing) and gently wash the site with plain soap and water. Gently pat the site dry.   Do not apply powder or lotion to the site.   Do not sit in a bathtub, swimming pool, or whirlpool for 5 to 7 days.   No bending, squatting, or lifting anything over 10 pounds (4.5 kg) for 1 week.   Inspect the site at least twice daily.   Do not drive home if you are discharged the same day of the procedure. Have someone else drive you.   You may drive 24 hours after the procedure unless otherwise instructed by your caregiver.  What to expect:  Any bruising will usually fade within 1 to 2 weeks.   Blood that collects in the tissue (hematoma) may be painful to the touch. It should usually decrease in size and tenderness within 1 to 2 weeks.  SEEK IMMEDIATE MEDICAL CARE IF:  You have unusual pain at the site or down the affected limb.   You  have redness, warmth, swelling, or pain at the site.   You have drainage (other than a small amount of blood on the dressing).   You have chills.   You have a fever or persistent symptoms for more than 72 hours.   You have a fever and your symptoms suddenly get worse.   Your leg becomes pale, cool, tingly, or numb.   You have heavy bleeding from the site. Hold pressure on the site.  Document Released: 10/23/2010 Document Revised: 09/09/2011 Document Reviewed:

## 2019-03-15 NOTE — Progress Notes (Signed)
Hematuria noted in patient's urinal this morning.  Patient states he did have some burning with urination once during the night.  Will continue to monitor.  Mark Garner Cipro

## 2019-03-15 NOTE — Discharge Summary (Signed)
Discharge Summary    Patient ID: Mark Garner,  MRN: [id number], DOB/AGE: [birth year] 73 y.o.  Admit date: 03/14/2019 Discharge date: 03/15/2019  Primary Care Provider: Dorthy Cooler, [place] Primary Cardiologist: Mertie Moores, MD   Discharge Diagnoses    Principal Problem:   Non-ST elevation (NSTEMI) myocardial infarction [facility]) Active Problems:   Hyperlipidemia   Allergies Allergies  Allergen Reactions  . Fenofibrate Other (See Comments)    Leg pain  . Gemfibrozil Other (See Comments)    Leg pain  . Niacin And Related     Severe flushing    Diagnostic Studies/Procedures    CARDIAC CATH: 03/14/2019 1. Multivessel CAD in a diffuse, small vessel disease pattern 2. Severe mid-circumflex stenosis treated successfully with PTCA and DES implantation (2.25x22 mm Resolute Onyx) 3. Severe mid and distal LAD stenoses successfully treated with PTCA and stenting using a 2.5x22 mm Resolute Onyx and 2.0x15 mm Resolute Onyx, respectively 4. Diffuse nonobstructive RCA stenosis with severe stenosis of the ostial right PDA not favorable for PCI in small vessel with ostial lesion location 5. Moderate residual disease in the ramus intermedius  Recommend: 2D Echo for LVEF assessment, ASA/ticagrelor x 12 months minimum, med Rx for residual CAD, aggressive lipid lowering as tolerated  Intervention    _____________   History of Present Illness     Mark Garner is a 73 y.o. male with a history of RBBB, cp w/ nl MV 02/08/2019, HLD, neuropathy, GERD, who had been seeing Dr Acie Fredrickson for chest pain and palpitations, cath planned 06/11. Symptoms worsened and he came to the hospital on 06/10.  Hospital Course     Consultants: None   His enzymes elevated, peak 0.83, indicating a NSTEMI. He was taken to the cath lab on 06/10, results above. He had 3 stents placed, tolerating the procedure well.   06/11, he was seen by Dr Meda Coffee and all data were reviewed.   He has not been able to take  statins, will refer to the [facility] for [place].  Had brief run SVT, 8 bts. Also had brief Afib on event monitor. HR high 50s at times, continue low-dose metoprolol bid, no dose change.   CHA2DS2-VASc = 3 (age x 1, HTN, CAD). Needs DOAC, start Eliquis.  BP elevated at first, but generally well-controlled, no med changes.  Will continue Imdur for distal PDA dz.   He was seen by cardiac rehab>>will f/u as an outpt.   No further inpatient workup is indicated and he is considered stable for discharge, to follow up as an outpt.   _____________  Discharge Vitals Blood pressure (!) 114/44, pulse 63, temperature 98.6 F (37 C), temperature source Oral, resp. rate 20, height 5\' 9"  (1.753 m), weight 76.1 kg, SpO2 96 %.  Filed Weights   03/14/19 0755 03/15/19 0608  Weight: 76.7 kg 76.1 kg    Labs & Radiologic Studies    CBC Recent Labs    03/14/19 0847 03/15/19 0218  WBC 7.7 9.5  HGB 12.5* 11.9*  HCT 36.7* 33.3*  MCV 89.1 86.3  PLT 258 284   Basic Metabolic Panel Recent Labs    03/14/19 0847 03/15/19 0218  NA 140 138  K 4.3 3.8  CL 108 107  CO2 22 20*  GLUCOSE 110* 85  BUN 13 13  CREATININE 0.90 0.90  CALCIUM 8.8* 8.4*   Liver Function Tests Recent Labs    03/15/19 0218  AST 20  ALT 22  ALKPHOS 40  BILITOT 0.7  PROT 5.8*  ALBUMIN 3.3*    Cardiac Enzymes Recent Labs    03/14/19 1422 03/14/19 2031 03/15/19 0218  TROPONINI 0.29* 0.55* 0.83*   Fasting Lipid Panel Cholesterol  Date Value Ref Range Status  03/14/2019 168 0 - 200 mg/dL Final   HDL  Date Value Ref Range Status  03/14/2019 30 (L) >40 mg/dL Final   LDL Cholesterol  Date Value Ref Range Status  03/14/2019 95 0 - 99 mg/dL Final    Comment:           Total Cholesterol/HDL:CHD Risk Coronary Heart Disease Risk Table                     Men   Women  1/2 Average Risk   3.4   3.3  Average Risk       5.0   4.4  2 X Average Risk   9.6   7.1  3 X Average Risk  23.4   11.0        Use  the calculated Patient Ratio above and the CHD Risk Table to determine the patient's CHD Risk.        ATP III CLASSIFICATION (LDL):  <100     mg/dL   Optimal  100-129  mg/dL   Near or Above                    Optimal  130-159  mg/dL   Borderline  160-189  mg/dL   High  >190     mg/dL   Very High Performed at [facility] [address], [place], [place] [address]    Triglycerides  Date Value Ref Range Status  03/14/2019 217 (H) <150 mg/dL Final   Direct LDL  Date Value Ref Range Status  06/14/2014 80.7 mg/dL Final    Comment:    Optimal:  <100 mg/dLNear or Above Optimal:  100-129 mg/dLBorderline High:  130-159 mg/dLHigh:  160-189 mg/dLVery High:  >190 mg/dL    Thyroid Function Tests Recent Labs    03/14/19 1020  TSH 4.319   _____________  No results found. Disposition   Pt is being discharged home today in good condition.  Follow-up Plans & Appointments       Discharge Medications   Allergies as of 03/15/2019      Reactions   Fenofibrate Other (See Comments)   Leg pain   Gemfibrozil Other (See Comments)   Leg pain   Niacin And Related    Severe flushing      Medication List    TAKE these medications   acetaminophen 500 MG tablet Commonly known as: TYLENOL Take 500 mg by mouth every 6 (six) hours as needed (pain).   aspirin EC 81 MG tablet Take 1 tablet (81 mg total) by mouth daily.   famotidine 20 MG tablet Commonly known as: PEPCID Take 20 mg by mouth daily.   Fish Oil 1000 MG Caps Take 2,000 mg by mouth 2 (two) times a day.   GAS-X EXTRA STRENGTH PO Take 1 capsule by mouth 4 (four) times daily as needed (gas).   isosorbide mononitrate 30 MG 24 hr tablet Commonly known as: IMDUR Take 1 tablet (30 mg total) by mouth daily.   lamoTRIgine 100 MG tablet Commonly known as: LaMICtal Take 0.5 tablets (50 mg total) by mouth 2 (two) times daily.   metoprolol tartrate 25 MG tablet Commonly known as: LOPRESSOR Take 0.5 tablets (12.5 mg  total) by mouth 2 (  two) times daily.   nitroGLYCERIN 0.4 MG SL tablet Commonly known as: NITROSTAT Place 1 tablet (0.4 mg total) under the tongue every 5 (five) minutes as needed for chest pain.   ticagrelor 90 MG Tabs tablet Commonly known as: BRILINTA Take 1 tablet (90 mg total) by mouth 2 (two) times daily.        Aspirin prescribed at discharge?  Yes High Intensity Statin Prescribed? (Lipitor 40-80mg  or Crestor 20-40mg ): No: intolerant Beta Blocker Prescribed? Yes For EF <40%, was ACEI/ARB Prescribed? No: EF NL ADP Receptor Inhibitor Prescribed? (i.e. Plavix etc.-Includes Medically Managed Patients): Yes For EF <40%, Aldosterone Inhibitor Prescribed? No: EF NL Was EF assessed during THIS hospitalization? Yes Was Cardiac Rehab II ordered? (Included Medically managed Patients): Yes   Outstanding Labs/Studies   None  Duration of Discharge Encounter   Greater than 30 minutes including physician time.  Alcario Drought Barrett NP 03/15/2019, 8:28 AM   The patient was seen, examined and discussed with Rosaria Ferries, PA-C and I agree with the above.   The patient is doing well post intervention yesterday, good pulses in his right hand, no bleeding from the access site. He was started on DAPT with aspirin and Brilinta. His event monitor showed an episode of atrial fibrillation - that appears to be short. I have reviewed his echocardiogram that shows hyperdynamic LVEF 65-70%, mild MR and mildly dilated left atrium. Given that he is on DAPT I am reluctant to start NOAC in addition to it. I would wait for the full report from the event monitor and if it shows long and frequent episodes of atrial fibrillation I would add Eliquis at that time. He is being discharged to day, we will arrange for an outpatient follow up with Dr Acie Fredrickson.   Ena Dawley, MD 03/15/2019

## 2019-03-16 ENCOUNTER — Telehealth: Payer: Self-pay | Admitting: Cardiovascular Disease

## 2019-03-16 NOTE — Telephone Encounter (Signed)
Spoke with pt regarding his left arm sensations. He states he believes it was from his sleeping position last night. It was only momentary, but since has dissipated. He understands if he has sustained left arm, shoulder, jaw, or CP he should use his nitro. He understands to call EMS if pain does not subside with nitro use.   I reviewed driving restrictions and discharge instructions with him as follows:  NSTEMI: No driving for 1 week. No lifting over 10 lbs for 2 weeks. No sexual activity for 2 weeks. Keep procedure site clean & dry. If you notice increased pain, swelling, bleeding or pus, call/return! You may shower, but no soaking baths/hot tubs/pools for 1 week.  He Verbalized understanding.   Pt also has questions regarding Xanax usage for sleep if needed. I advised him he could call his PCP to discuss.  He thanked me for the call and stated "You guys have been superb throughout this whole ordeal!"

## 2019-03-16 NOTE — Telephone Encounter (Signed)
Agree with note by Dollene Primrose, RN.

## 2019-03-16 NOTE — Telephone Encounter (Signed)
New Message   Patient calling stating his AVS said for him not to drive for two weeks and then it says he can drive after a day.  He also states he's not having no pain or anything he just feel twinges in his left arm and shoulder.  He will call back in if he needs a refill for Xanax.

## 2019-03-21 ENCOUNTER — Telehealth: Payer: Self-pay | Admitting: Cardiovascular Disease

## 2019-03-21 NOTE — Telephone Encounter (Signed)
The patient was examined by Dr. Burt Knack personally.  Dr. Burt Knack noted diffuse ecchymosis but otherwise benign exam. No new orders were given.  The patient has a virtual visit 6/22 with Richardson Dopp.

## 2019-03-21 NOTE — Telephone Encounter (Signed)
Spoke with Dr. Burt Knack and he said to bring pt in so he can look at site.  Spoke with pt and he will come to the office around 10A so site can be checked.

## 2019-03-21 NOTE — Telephone Encounter (Signed)
Monitor strip received.  Shoes Sinus brady with a run of V-tach Artifact. HR got up to 157.  Pt states he was asleep at the time and did not feel it or wake up.  Pt aware to continue wearing monitor.  Reviewed by DOD, Dr. Burt Knack, no changes.

## 2019-03-21 NOTE — Telephone Encounter (Signed)
Patient is called stating his right arm is still very painful, it wakes him up at night, it is throbbing pain. He had a cath done last week Wednesday.

## 2019-03-25 DIAGNOSIS — I472 Ventricular tachycardia: Secondary | ICD-10-CM | POA: Insufficient documentation

## 2019-03-25 DIAGNOSIS — I480 Paroxysmal atrial fibrillation: Secondary | ICD-10-CM | POA: Insufficient documentation

## 2019-03-25 DIAGNOSIS — I4729 Other ventricular tachycardia: Secondary | ICD-10-CM | POA: Insufficient documentation

## 2019-03-25 NOTE — Progress Notes (Signed)
Virtual Visit via Video Note   This visit type was conducted due to national recommendations for restrictions regarding the COVID-19 Pandemic (e.g. social distancing) in an effort to limit this patient's exposure and mitigate transmission in our community.  Due to his co-morbid illnesses, this patient is at least at moderate risk for complications without adequate follow up.  This format is felt to be most appropriate for this patient at this time.  All issues noted in this document were discussed and addressed.  A limited physical exam was performed with this format.  Please refer to the patient's chart for his consent to telehealth for [facility].   Date:  03/26/2019   ID:  Mark Garner, DOB December 10, 1945, MRN [id number]  Patient Location: Other:  [place], [facility] Provider Location: Office  PCP:  Lujean Amel, MD  Cardiologist:  Mertie Moores, MD   Electrophysiologist:  None   Evaluation Performed:  Follow-Up Visit  Chief Complaint:  Post hospital follow up   History of Present Illness:    Mark Garner is a 73 y.o. male with a right bundle branch block.  He was evaluated by Dr. Acie Fredrickson May 26 for chest pain and palpitations.  An event monitor demonstrated paroxysmal atrial fibrillation as well as nonsustained ventricular tachycardia.  Stress testing demonstrated no ischemia.  However, the patient continued have symptoms and he was set up for cardiac catheterization.   Cardiac catheterization 03/14/2019 demonstrated multivessel CAD with diffuse small vessel disease pattern.  He underwent DES to the mid LCx as well as DES x 2 to the LAD.  There was diffuse nonobstructive disease in the RCA with severe stenosis of the ostial RPDA which is small and not favorable for PCI and mod stenosis in the RI.  An echocardiogram demonstrated normal LV function.  He was noted to have another episode of atrial fibrillation on telemetry.  CHADS2-VASc=3 (age x 1, HTN, CAD).  The episode of atrial  fibrillation was short.  Given that he needs dual antiplatelet therapy, it was felt that anticoagulation should be deferred until the final results are obtained on his monitor.  DOAC would be recommended if he had more, prolonged episodes of atrial fibrillation.    Today, he is seen while he is vacationing in [place].  He notes significant fatigue since starting on metoprolol.  However, this seems to be improving.  His right wrist is still painful but is improving.  The bruising is improving.  He had some indigestion last night but has not had any anginal symptoms.  He has not had shortness of breath.  He has not had syncope, orthopnea or leg swelling.  He has not had any bleeding issues.  The patient does not have symptoms concerning for COVID-19 infection (fever, chills, cough, or new shortness of breath).    Past Medical History:  Diagnosis Date  . Chest pain   . Chest tightness 04/06/2011  . Dysesthesia 10/19/2016  . Heart murmur   . History of cervical spinal surgery 10/19/2016  . History of lumbosacral spine surgery 10/19/2016  . Hyperlipidemia   . Indigestion   . Non-ST elevation (NSTEMI) myocardial infarction ([place]) 03/15/2019  . Normal echocardiogram 05/31/05   normal LV function; trace MR;trace tricuspid regurgitation  . Normal nuclear stress test 07/26/02   no evidence of ischemia; nml LVF  . Polyneuropathy 10/19/2016  . Right bundle branch block   . Trace mitral regurgitation by prior echocardiogram   . Trace tricuspid regurgitation by prior echocardiogram   .  Unstable angina ([place]) 03/14/2019  . Upper airway cough syndrome 09/12/2014   Followed in Pulmonary clinic/ [facility]/ [place]  - gerd rx 09/12/2014 >>>    Past Surgical History:  Procedure Laterality Date  . CORONARY STENT INTERVENTION N/A 03/14/2019   Procedure: CORONARY STENT INTERVENTION;  Surgeon: [name], MD;  Location: [facility];  Service: Cardiovascular;  Laterality: N/A;  . LEFT HEART CATH AND  CORONARY ANGIOGRAPHY N/A 03/14/2019   Procedure: LEFT HEART CATH AND CORONARY ANGIOGRAPHY;  Surgeon: [name], MD;  Location: [facility];  Service: Cardiovascular;  Laterality: N/A;  . SPINE SURGERY     x 2      Current Meds  Medication Sig  . acetaminophen (TYLENOL) 500 MG tablet Take 500 mg by mouth every 6 (six) hours as needed (pain).   Marland Kitchen aspirin EC 81 MG tablet Take 1 tablet (81 mg total) by mouth daily.  . famotidine (PEPCID) 20 MG tablet Take 20 mg by mouth daily.  . isosorbide mononitrate (IMDUR) 30 MG 24 hr tablet Take 1 tablet (30 mg total) by mouth daily.  Marland Kitchen lamoTRIgine (LAMICTAL) 100 MG tablet Take 0.5 tablets (50 mg total) by mouth 2 (two) times daily.  . metoprolol tartrate (LOPRESSOR) 25 MG tablet Take 0.5 tablets (12.5 mg total) by mouth 2 (two) times daily.  . nitroGLYCERIN (NITROSTAT) 0.4 MG SL tablet Place 1 tablet (0.4 mg total) under the tongue every 5 (five) minutes as needed for chest pain.  . Omega-3 Fatty Acids (FISH OIL) 1000 MG CAPS Take 2,000 mg by mouth 2 (two) times a day.  . Simethicone (GAS-X EXTRA STRENGTH PO) Take 1 capsule by mouth 4 (four) times daily as needed (gas).   . ticagrelor (BRILINTA) 90 MG TABS tablet Take 1 tablet (90 mg total) by mouth 2 (two) times daily.     Allergies:   Fenofibrate, Gemfibrozil, and Niacin and related   Social History   Tobacco Use  . Smoking status: Former Smoker    Packs/day: 1.00    Years: 5.00    Pack years: 5.00    Types: Cigarettes    Quit date: 10/04/1984    Years since quitting: 34.4  . Smokeless tobacco: Never Used  Substance Use Topics  . Alcohol use: No    Alcohol/week: 0.0 standard drinks  . Drug use: No     Family Hx: The patient's family history includes Heart disease (age of onset: 23) in his brother.  ROS:   Please see the history of present illness.     All other systems reviewed and are negative.   Prior CV studies:   The following studies were reviewed today:   Echocardiogram 03/15/2019 EF 89-21, grade 2 diastolic dysfunction, moderate calcification of the aortic valve  Cardiac catheterization 03/14/2019 LAD ostial 30, mid 75, distal 95 RI ostial 60 LCx proximal 95, distal 50 RCA proximal 40, distal 50; RPDA ostial 80 PCI: 2.5 x 22 mm resolute DES to the mid LAD; 2.0 x 15 mm resolute DES to the distal LAD PCI: 2.25 x 22 mm resolute Onyx DES to the proximal     Event monitor 02/2019 One episode of nonsustained VT One episode of paroxysmal atrial fibrillation  Myoview 02/08/2019  Nuclear stress EF: 63%. The left ventricular ejection fraction is normal (55-65%).  This is a low risk study. no evidence of ischemia or infarction  The study is normal.    Labs/Other Tests and Data Reviewed:    EKG:  No  ECG reviewed.  Recent Labs: 03/14/2019: TSH 4.319 03/15/2019: ALT 22; BUN 13; Creatinine, Ser 0.90; Hemoglobin 11.9; Platelets 245; Potassium 3.8; Sodium 138   Recent Lipid Panel Lab Results  Component Value Date/Time   CHOL 168 03/14/2019 08:47 AM   TRIG 217 (H) 03/14/2019 08:47 AM   HDL 30 (L) 03/14/2019 08:47 AM   CHOLHDL 5.6 03/14/2019 08:47 AM   LDLCALC 95 03/14/2019 08:47 AM   LDLDIRECT 80.7 06/14/2014 08:56 AM    Wt Readings from Last 3 Encounters:  03/26/19 169 lb (76.7 kg)  03/15/19 167 lb 12.3 oz (76.1 kg)  03/12/19 169 lb (76.7 kg)     Objective:    Vital Signs:  Pulse 68   Ht 5\' 9"  (1.753 m)   Wt 169 lb (76.7 kg)   BMI 24.96 kg/m    VITAL SIGNS:  reviewed GEN:  no acute distress EYES:  sclerae anicteric, EOMI - Extraocular Movements Intact RESPIRATORY:  normal respiratory effort MUSCULOSKELETAL:  R wrist with + ecchymosis NEURO:  alert and oriented x 3, no obvious focal deficit PSYCH:  normal affect  ASSESSMENT & PLAN:    1. Non-ST elevation (NSTEMI) myocardial infarction [facility]) Status post recent admission with minimally elevated troponin levels and significant three-vessel CAD on cardiac catheterization.   EF was normal on Echo.  He underwent DES to the mid LCx and DES x2 to the LAD.  He has residual nonobstructive disease in the RCA and severe stenosis in ostial RPDA which is not favorable for PCI.  He does feel somewhat fatigued with long-acting nitrates and metoprolol.  However, this seems to be improving.  He had significant wrist pain but this is also improving.  He did check his blood pressure during our visit and it was 179/94.  He has not had any medications yet today.  I have asked him to keep an eye on this.  Continue dual antiplatelet therapy with aspirin, Brilinta.  Keep follow-up with Dr. Acie Fredrickson on 8/22 as planned.  2. PAF (paroxysmal atrial fibrillation) ([place]) Event monitor is still pending.  If he has recurrent episodes of atrial fibrillation, he will likely need anticoagulation.  3. NSVT (nonsustained ventricular tachycardia) ([place]) This may have been ischemic related.  EF was normal on echocardiogram.  Event monitor is pending.  4. Mixed hyperlipidemia He is intolerant of statins.  He is willing to try Zetia.  Start Zetia 10 mg daily.  Obtain lipids and LFTs in 3 months.  If he has side effects to Zetia, refer to lipid clinic for PCSK9 inhibitor therapy.   COVID-19 Education: The signs and symptoms of COVID-19 were discussed with the patient and how to seek care for testing (follow up with PCP or arrange E-visit).  The importance of social distancing was discussed today.  Time:   Today, I have spent 22 minutes with the patient with telehealth technology discussing the above problems.     Medication Adjustments/Labs and Tests Ordered: Current medicines are reviewed at length with the patient today.  Concerns regarding medicines are outlined above.   Tests Ordered: Orders Placed This Encounter  Procedures  . Hepatic function panel  . Lipid panel    Medication Changes: Meds ordered this encounter  Medications  . DISCONTD: ezetimibe (ZETIA) 10 MG tablet    Sig: Take 1  tablet (10 mg total) by mouth daily.    Dispense:  90 tablet    Refill:  3  . DISCONTD: ezetimibe (ZETIA) 10 MG tablet    Sig: Take 1  tablet (10 mg total) by mouth daily.    Dispense:  30 tablet    Refill:  0    Follow Up:  In Person 05/26/2019 with Dr. Acie Fredrickson as planned  Signed, Richardson Dopp, PA-C  03/26/2019 4:46 PM    [place]

## 2019-03-26 ENCOUNTER — Other Ambulatory Visit: Payer: Self-pay

## 2019-03-26 ENCOUNTER — Telehealth: Payer: Self-pay | Admitting: *Deleted

## 2019-03-26 ENCOUNTER — Telehealth (INDEPENDENT_AMBULATORY_CARE_PROVIDER_SITE_OTHER): Payer: Medicare Other | Admitting: Physician Assistant

## 2019-03-26 ENCOUNTER — Encounter: Payer: Self-pay | Admitting: Physician Assistant

## 2019-03-26 ENCOUNTER — Other Ambulatory Visit: Payer: Self-pay | Admitting: Physician Assistant

## 2019-03-26 VITALS — HR 68 | Ht 69.0 in | Wt 169.0 lb

## 2019-03-26 DIAGNOSIS — I480 Paroxysmal atrial fibrillation: Secondary | ICD-10-CM

## 2019-03-26 DIAGNOSIS — I472 Ventricular tachycardia: Secondary | ICD-10-CM

## 2019-03-26 DIAGNOSIS — I4729 Other ventricular tachycardia: Secondary | ICD-10-CM

## 2019-03-26 DIAGNOSIS — E782 Mixed hyperlipidemia: Secondary | ICD-10-CM

## 2019-03-26 DIAGNOSIS — I214 Non-ST elevation (NSTEMI) myocardial infarction: Secondary | ICD-10-CM

## 2019-03-26 MED ORDER — EZETIMIBE 10 MG PO TABS: 10.0000 mg | ORAL_TABLET | Freq: Every day | ORAL | 3 refills | Status: DC

## 2019-03-26 MED ORDER — EZETIMIBE 10 MG PO TABS: 10.0000 mg | ORAL_TABLET | Freq: Every day | ORAL | 0 refills | Status: DC

## 2019-03-26 NOTE — Telephone Encounter (Signed)
lvmon on patient to call back to confirm lab appointment date time  was okay

## 2019-03-26 NOTE — Patient Instructions (Signed)
Medication Instructions:   Start taking Zetia 10 mg once a day   If you need a refill on your cardiac medications before your next appointment, please call your pharmacy.   Lab work: in 3 months fasting labs LFT And LIPIDS  in 3 months     If you have labs (blood work) drawn today and your tests are completely normal, you will receive your results only by: Marland Kitchen MyChart Message (if you have MyChart) OR . A paper copy in the mail If you have any lab test that is abnormal or we need to change your treatment, we will call you to review the results.  Testing/Procedures: NONE ORDERED  TODAY    Follow-Up: As scheduled    Any Other Special Instructions Will Be Listed Below (If Applicable).

## 2019-03-29 ENCOUNTER — Telehealth: Payer: Self-pay | Admitting: Cardiovascular Disease

## 2019-03-29 NOTE — Telephone Encounter (Signed)
Patient to advised [name] that we will be receiving enrollment for cardiac rehab at [facility].

## 2019-04-05 ENCOUNTER — Telehealth: Payer: Self-pay | Admitting: Cardiovascular Disease

## 2019-04-05 NOTE — Telephone Encounter (Signed)
Spoke with the patient, he stated when he got back from his workout an hour later he checked his HR and BP, He stated his heart rate was beating irregularly. He said it would " beat, beat then quick pause and beat, beat". He said he tried to count the beats himself but could not. He said he felt totally fine during his bike ride and did not even hardly sweat. This was one of the first exercises since his cath. He checked his beating while I was talking with him and 2.5 hours later he said he still feels this occurring.

## 2019-04-05 NOTE — Telephone Encounter (Signed)
It appears that he has an event monitor currently .   We are looking for atrial fib.  When we get the monitor back , we will know for sure.  If he is found to have AFib, will need to start [facility]

## 2019-04-05 NOTE — Telephone Encounter (Signed)
Patient states he took a 5 mile bike ride he felt fine however his pulse was irrregular. He is wondering if it has something to do with the medication.

## 2019-04-09 ENCOUNTER — Other Ambulatory Visit: Payer: Self-pay | Admitting: Cardiovascular Disease

## 2019-04-09 ENCOUNTER — Telehealth: Payer: Self-pay | Admitting: Cardiovascular Disease

## 2019-04-09 ENCOUNTER — Other Ambulatory Visit: Payer: Self-pay

## 2019-04-09 DIAGNOSIS — R000 Tachycardia, unspecified: Secondary | ICD-10-CM

## 2019-04-09 DIAGNOSIS — G4733 Obstructive sleep apnea (adult) (pediatric): Secondary | ICD-10-CM

## 2019-04-09 DIAGNOSIS — I480 Paroxysmal atrial fibrillation: Secondary | ICD-10-CM

## 2019-04-09 DIAGNOSIS — R079 Chest pain, unspecified: Secondary | ICD-10-CM

## 2019-04-09 MED ORDER — APIXABAN 5 MG PO TABS: 5.0000 mg | ORAL_TABLET | Freq: Two times a day (BID) | ORAL | 11 refills | Status: DC

## 2019-04-09 MED ORDER — CLOPIDOGREL BISULFATE 75 MG PO TABS: ORAL_TABLET | ORAL | 3 refills | Status: DC

## 2019-04-09 NOTE — Addendum Note (Signed)
Addended by: Emmaline Life on: 04/09/2019 05:27 PM   Modules accepted: Orders

## 2019-04-09 NOTE — Telephone Encounter (Signed)
Called Mark Garner.  He is having dyspnea with Brilinta.  Monitor shows PAF - episodes lasting as long as 39 minutes Occurs primarily at night Concerning for OSA.  He will need to start DOAC  1.  DC Brilinta 2.  Start Plavix 300 mg loading dose on day 1 followed by 75 mg daily for a year 3.  Continue ASA 81 mg a day for 1 month 4.  Start Eliquis 5 mg PO BID   [facility], [address], [place]  5.  Please set up a home sleep study. If abnormal , he would like to see Dr. Maxwell Caul for sleep medicine.   I have explained the loading dose of Plavix to [place] and he understands.  He has an appt with me in August.    He will call us back for any problems    Mertie Moores, MD  04/09/2019 5:16 PM    [facility],  [place] [place], [place]  [address] Pager [phone] Phone: [phone]; Fax: [phone]

## 2019-04-09 NOTE — Telephone Encounter (Signed)
Patient is returning a call from earlier today.

## 2019-04-10 ENCOUNTER — Telehealth: Payer: Self-pay | Admitting: Cardiovascular Disease

## 2019-04-10 NOTE — Telephone Encounter (Signed)
Jasmine from [facility] wanted to confirm that Dr. Elmarie Shiley office received a fax from their office for the patient to start Cardiac rehab The patient is calling Cardiac Rehab and asking about it.  Jasmine said the fax was sent 2x, June 25 and July 6th

## 2019-04-10 NOTE — Telephone Encounter (Signed)
Dr. Acie Fredrickson has signed the order and is faxing today from the office I called and spoke with [facility] and gave her that information including the ICD-10 code for NSTEMI for the cardiac rehab. Jasmine thanked me for the call.

## 2019-04-27 ENCOUNTER — Telehealth: Payer: Self-pay | Admitting: Internal Medicine

## 2019-04-27 NOTE — Telephone Encounter (Signed)

## 2019-04-30 ENCOUNTER — Other Ambulatory Visit: Payer: Self-pay

## 2019-04-30 ENCOUNTER — Encounter: Payer: Self-pay | Admitting: Internal Medicine

## 2019-04-30 ENCOUNTER — Ambulatory Visit (INDEPENDENT_AMBULATORY_CARE_PROVIDER_SITE_OTHER): Payer: Medicare Other | Admitting: Internal Medicine

## 2019-04-30 VITALS — BP 138/78 | HR 53 | Temp 97.7°F | Ht 69.0 in | Wt 166.0 lb

## 2019-04-30 DIAGNOSIS — E785 Hyperlipidemia, unspecified: Secondary | ICD-10-CM | POA: Diagnosis not present

## 2019-04-30 DIAGNOSIS — E782 Mixed hyperlipidemia: Secondary | ICD-10-CM

## 2019-04-30 DIAGNOSIS — I2510 Atherosclerotic heart disease of native coronary artery without angina pectoris: Secondary | ICD-10-CM | POA: Diagnosis not present

## 2019-04-30 DIAGNOSIS — Z789 Other specified health status: Secondary | ICD-10-CM | POA: Diagnosis not present

## 2019-04-30 NOTE — Progress Notes (Signed)
LIPID CLINIC CONSULT NOTE  Chief Complaint:  No complaints  Primary Care Physician: Mark Amel, MD  Primary Cardiologist:  Mark Moores, MD  HPI:  Mark Garner is a 73 y.o. male who is being seen today for the evaluation of dyslipidemia at the request of Mark Garner, Mark Croon, PA-C.  Mark Garner is a 73 year old male with a history of right bundle branch block and chest pain who apparently had a normal Myoview in May 2020, also risk factors including dyslipidemia who recently had chest pain and palpitations.  He was set up for outpatient cardiac catheterization however his symptoms worsened and presented to hospital on June 10.  Troponins were elevated and was taken to the Cath Lab.  He was found to have multivessel coronary disease with severe mid circumflex stenosis which was treated with a drug-eluting stent.  He also had severe mid and distal LAD stenoses with 2 additional drug-eluting stents placed.  The RCA was diffusely diseased and nonobstructive.  Since then he reports improvement of his symptoms.  As far as his palpitations are concerned, he had a brief run of SVT and some A. fib and therefore has been on Eliquis.  He was then placed on aspirin and Brilinta however Brilinta was eventually changed to Plavix and his aspirin was discontinued.  He unfortunately has a history of intolerance to statins.  He is failed more than 3 different statin medications over the past 10 years and has been intolerant to both fenofibrate and gemfibrozil as well as niacin which caused severe flushing.  He has been maintained on ezetimibe and over-the-counter fish oil.  His most recent lipid profile showed a total cholesterol 168, triglycerides 217, HDL 30 and LDL 95.  His goal LDL is less than 70.  We discussed options today for treatment including PCSK9 inhibitors or Vascepa.  He actually would qualify for both, however due to cost issues with Medicare the PCP may be prohibitively expensive for him.  PMHx:   Past Medical History:  Diagnosis Date  . Chest pain   . Chest tightness 04/06/2011  . Dysesthesia 10/19/2016  . Heart murmur   . History of cervical spinal surgery 10/19/2016  . History of lumbosacral spine surgery 10/19/2016  . Hyperlipidemia   . Indigestion   . Non-ST elevation (NSTEMI) myocardial infarction ([place]) 03/15/2019  . Normal echocardiogram 05/31/05   normal LV function; trace MR;trace tricuspid regurgitation  . Normal nuclear stress test 07/26/02   no evidence of ischemia; nml LVF  . Polyneuropathy 10/19/2016  . Right bundle branch block   . Trace mitral regurgitation by prior echocardiogram   . Trace tricuspid regurgitation by prior echocardiogram   . Unstable angina ([place]) 03/14/2019  . Upper airway cough syndrome 09/12/2014   Followed in Pulmonary clinic/ [facility]/ [place]  - gerd rx 09/12/2014 >>>     Past Surgical History:  Procedure Laterality Date  . CORONARY STENT INTERVENTION N/A 03/14/2019   Procedure: CORONARY STENT INTERVENTION;  Surgeon: [name], MD;  Location: [facility];  Service: Cardiovascular;  Laterality: N/A;  . LEFT HEART CATH AND CORONARY ANGIOGRAPHY N/A 03/14/2019   Procedure: LEFT HEART CATH AND CORONARY ANGIOGRAPHY;  Surgeon: [name], MD;  Location: [facility];  Service: Cardiovascular;  Laterality: N/A;  . SPINE SURGERY     x 2     FAMHx:  Family History  Problem Relation Age of Onset  . Heart disease Brother 3       MI/ptca  SOCHx:   reports that he quit smoking about 34 years ago. His smoking use included cigarettes. He has a 5.00 pack-year smoking history. He has never used smokeless tobacco. He reports that he does not drink alcohol or use drugs.  ALLERGIES:  Allergies  Allergen Reactions  . Statins Other (See Comments)    Myalgias with multiple statins  . Fenofibrate Other (See Comments)    Leg pain  . Gemfibrozil Other (See Comments)    Leg pain  . Niacin And Related     Severe flushing     ROS: Pertinent items noted in HPI and remainder of comprehensive ROS otherwise negative.  HOME MEDS: Current Outpatient Medications on File Prior to Visit  Medication Sig Dispense Refill  . acetaminophen (TYLENOL) 500 MG tablet Take 500 mg by mouth every 6 (six) hours as needed (pain).     Marland Kitchen apixaban (ELIQUIS) 5 MG TABS tablet Take 1 tablet (5 mg total) by mouth 2 (two) times daily. 60 tablet 11  . aspirin EC 81 MG tablet Take 1 tablet (81 mg total) by mouth daily.    . clopidogrel (PLAVIX) 75 MG tablet Take 300 mg (4 tabs) on the first day then 75 mg (1 tab) daily 90 tablet 3  . ezetimibe (ZETIA) 10 MG tablet TAKE 1 TABLET(10 MG) BY MOUTH DAILY 90 tablet 0  . famotidine (PEPCID) 20 MG tablet Take 20 mg by mouth daily.    . isosorbide mononitrate (IMDUR) 30 MG 24 hr tablet Take 1 tablet (30 mg total) by mouth daily. 30 tablet 11  . lamoTRIgine (LAMICTAL) 100 MG tablet Take 0.5 tablets (50 mg total) by mouth 2 (two) times daily. 90 tablet 4  . metoprolol tartrate (LOPRESSOR) 25 MG tablet Take 0.5 tablets (12.5 mg total) by mouth 2 (two) times daily. 90 tablet 3  . nitroGLYCERIN (NITROSTAT) 0.4 MG SL tablet Place 1 tablet (0.4 mg total) under the tongue every 5 (five) minutes as needed for chest pain. 25 tablet 6  . Omega-3 Fatty Acids (FISH OIL) 1000 MG CAPS Take 2,000 mg by mouth 2 (two) times a day.    . Simethicone (GAS-X EXTRA STRENGTH PO) Take 1 capsule by mouth 4 (four) times daily as needed (gas).      No current facility-administered medications on file prior to visit.     LABS/IMAGING: No results found for this or any previous visit (from the past 48 hour(s)). No results found.  LIPID PANEL:    Component Value Date/Time   CHOL 168 03/14/2019 0847   TRIG 217 (H) 03/14/2019 0847   HDL 30 (L) 03/14/2019 0847   CHOLHDL 5.6 03/14/2019 0847   VLDL 43 (H) 03/14/2019 0847   LDLCALC 95 03/14/2019 0847   LDLDIRECT 80.7 06/14/2014 0856    WEIGHTS: Wt Readings from Last 3  Encounters:  04/30/19 166 lb (75.3 kg)  03/26/19 169 lb (76.7 kg)  03/15/19 167 lb 12.3 oz (76.1 kg)    VITALS: BP 138/78   Pulse (!) 53   Temp 97.7 F (36.5 C)   Ht 5\' 9"  (1.753 m)   Wt 166 lb (75.3 kg)   BMI 24.51 kg/m   EXAM: General appearance: alert and no distress Neck: no carotid bruit, no JVD and thyroid not enlarged, symmetric, no tenderness/mass/nodules Lungs: clear to auscultation bilaterally Heart: regular rate and rhythm, S1, S2 normal, no murmur, click, rub or gallop Abdomen: soft, non-tender; bowel sounds normal; no masses,  no organomegaly Extremities: extremities normal, atraumatic, no cyanosis  or edema Pulses: 2+ and symmetric Skin: pale, warm, dry Neurologic: Grossly normal Psych: Pleasant  EKG: Deferred  ASSESSMENT: 1. Mixed dyslipidemia, goal LDL<70 2. Multivessel CAD s/p PCI to the LCx and LAD x3 (03/2019) 3. PAF 4. Statin intolerance  PLAN: 1.   Mr. Purnell has multivessel coronary disease and as above target LDL less than 70.  He eats a fairly healthy diet.  Unfortunately is a strong family history of coronary disease and ultimately has developed significant coronary artery disease.  He is improved after recent multivessel stenting.  He is a good candidate for Repatha.  We discussed the medication today as well as injections and management.  We will go ahead and get prior authorization for the medication which I believe he will qualify for and start that as soon as possible.  Additionally, in the future he may be a candidate for Vascepa depending on whether he has persistently elevated triglycerides and risk.  Will repeat lipids in 4 months and plan follow-up at that time.  Thanks again for the kind referral.  Pixie Casino, MD, FACC, [place] Director of the Advanced Lipid Disorders &  Cardiovascular Risk Reduction Clinic Diplomate of the American Board of Clinical Lipidology Attending Cardiologist  Direct  Dial: [phone]  Fax: [phone]  Website:  [email or web address]  Nadean Corwin Massiel Stipp 04/30/2019, 10:10 AM

## 2019-04-30 NOTE — Patient Instructions (Addendum)
Medication Instructions:  Your physician has recommended you start Repatha, a medication that helps lower "bad" cholesterol. Please complete your Patient Assistance form and return it to the [facility] office. You will be contacted by a [facility] clinical pharmacist to discuss this in further detail.  Your physician has recommended you make the following change in your medication:  STOP TAKING YOUR [place] LIST   If you need a refill on your cardiac medications before your next appointment, please call your pharmacy.   Lab work: Your physician recommends that you return for lab work in: 3 months (FASTING LIPID PANEL)  If you have labs (blood work) drawn today and your tests are completely normal, you will receive your results only by: Marland Kitchen MyChart Message (if you have MyChart) OR . A paper copy in the mail If you have any lab test that is abnormal or we need to change your treatment, we will call you to review the results.  Testing/Procedures: NONE  Follow-Up: At [facility], you and your health needs are our priority.  As part of our continuing mission to provide you with exceptional heart care, we have created designated Provider Care Teams.  These Care Teams include your primary Cardiologist (physician) and Advanced Practice Providers (APPs -  Physician Assistants and Nurse Practitioners) who all work together to provide you with the care you need, when you need it. . You will need a follow up appointment in 4 months with Dr. Debara Pickett.  Please call our office 2 months in advance to schedule this appointment.

## 2019-05-01 ENCOUNTER — Telehealth: Payer: Self-pay | Admitting: Internal Medicine

## 2019-05-01 MED ORDER — REPATHA SURECLICK 140 MG/ML ~~LOC~~ SOAJ: 1.0000 | SUBCUTANEOUS | 11 refills | Status: DC

## 2019-05-01 NOTE — Telephone Encounter (Addendum)
Request Reference Number: [id number]. REPATHA SURE INJ 140MG /ML is approved through 11/01/2019  Patient notified via [place] message

## 2019-05-01 NOTE — Telephone Encounter (Signed)
PA for Repatha [employer] submitted via [email or web address] (Key: [id number])

## 2019-05-01 NOTE — Addendum Note (Signed)
Addended by: Fidel Levy on: 05/01/2019 04:26 PM   Modules accepted: Orders
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
## 2019-06-01 ENCOUNTER — Encounter: Payer: Self-pay | Admitting: Cardiovascular Disease

## 2019-06-01 ENCOUNTER — Ambulatory Visit (INDEPENDENT_AMBULATORY_CARE_PROVIDER_SITE_OTHER): Payer: Medicare Other | Admitting: Cardiovascular Disease

## 2019-06-01 ENCOUNTER — Other Ambulatory Visit: Payer: Self-pay

## 2019-06-01 VITALS — BP 138/88 | HR 65 | Ht 69.0 in | Wt 170.8 lb

## 2019-06-01 DIAGNOSIS — R0989 Other specified symptoms and signs involving the circulatory and respiratory systems: Secondary | ICD-10-CM

## 2019-06-01 DIAGNOSIS — I472 Ventricular tachycardia: Secondary | ICD-10-CM | POA: Diagnosis not present

## 2019-06-01 DIAGNOSIS — I214 Non-ST elevation (NSTEMI) myocardial infarction: Secondary | ICD-10-CM

## 2019-06-01 DIAGNOSIS — I779 Disorder of arteries and arterioles, unspecified: Secondary | ICD-10-CM | POA: Insufficient documentation

## 2019-06-01 DIAGNOSIS — I739 Peripheral vascular disease, unspecified: Secondary | ICD-10-CM | POA: Diagnosis not present

## 2019-06-01 DIAGNOSIS — I4729 Other ventricular tachycardia: Secondary | ICD-10-CM

## 2019-06-01 NOTE — Patient Instructions (Signed)
Your physician recommends that you continue on your current medications as directed. Please refer to the Current Medication list given to you today.  Your physician wants you to follow-up in: 6 MONTHS WITH DR NAHSER  You will receive a reminder letter in the mail two months in advance. If you don't receive a letter, please call our office to schedule the follow-up appointment.  

## 2019-06-08 ENCOUNTER — Other Ambulatory Visit: Payer: Self-pay

## 2019-06-08 ENCOUNTER — Ambulatory Visit (HOSPITAL_COMMUNITY)
Admission: RE | Admit: 2019-06-08 | Discharge: 2019-06-08 | Disposition: A | Discharge status: Home / Self Care | Payer: Medicare Other | Source: Ambulatory Visit | Attending: Cardiology | Admitting: Cardiology

## 2019-06-08 DIAGNOSIS — R0989 Other specified symptoms and signs involving the circulatory and respiratory systems: Secondary | ICD-10-CM | POA: Insufficient documentation

## 2019-06-14 ENCOUNTER — Other Ambulatory Visit: Payer: Self-pay | Admitting: Nurse Practitioner

## 2019-06-14 DIAGNOSIS — I6523 Occlusion and stenosis of bilateral carotid arteries: Secondary | ICD-10-CM

## 2019-06-14 DIAGNOSIS — R0989 Other specified symptoms and signs involving the circulatory and respiratory systems: Secondary | ICD-10-CM

## 2019-06-26 ENCOUNTER — Other Ambulatory Visit: Payer: Medicare Other

## 2019-06-26 LAB — LIPID PANEL
Chol/HDL Ratio: 2.5 ratio (ref 0.0–5.0)
Cholesterol, Total: 90 mg/dL — ABNORMAL LOW (ref 100–199)
HDL: 36 mg/dL — ABNORMAL LOW
LDL Chol Calc (NIH): 31 mg/dL (ref 0–99)
Triglycerides: 128 mg/dL (ref 0–149)
VLDL Cholesterol Cal: 23 mg/dL (ref 5–40)

## 2019-07-19 ENCOUNTER — Other Ambulatory Visit: Payer: Self-pay

## 2019-07-19 DIAGNOSIS — Z20822 Contact with and (suspected) exposure to covid-19: Secondary | ICD-10-CM

## 2019-07-20 LAB — NOVEL CORONAVIRUS, NAA: SARS-CoV-2, NAA: NOT DETECTED

## 2019-08-20 ENCOUNTER — Other Ambulatory Visit: Payer: Self-pay | Admitting: Cardiovascular Disease

## 2019-10-08 ENCOUNTER — Telehealth: Payer: Self-pay | Admitting: Cardiovascular Disease

## 2019-10-08 NOTE — Telephone Encounter (Signed)
Advised patient that letter has been released to his MyChart. He thanked me for the call.

## 2019-10-08 NOTE — Telephone Encounter (Signed)
I agree that Mark Garner should receive the Covid vaccine.    Mertie Moores, MD  10/08/2019 2:14 PM    [facility],  [address] [place], [place]  [address] Phone: [phone]; Fax: [phone]

## 2019-10-08 NOTE — Telephone Encounter (Signed)
  Patient has an appt on Thursday  10/11/19 to get the Covid vaccine and one of the questions asked was if he sees a cardiologist does he have permission to get the vaccine. He would like to get permission from Dr Acie Fredrickson and it can be put on his 6. He will need it by Wednesday night.

## 2019-10-10 ENCOUNTER — Telehealth: Payer: Self-pay | Admitting: Internal Medicine

## 2019-10-10 NOTE — Telephone Encounter (Signed)
Request Reference Number: [id number]. REPATHA SURE INJ 140MG /ML is approved through 10/03/2020

## 2019-10-10 NOTE — Telephone Encounter (Signed)
Prior authorization for Repatha submitted via [email or web address] (Key: [id number])

## 2019-11-05 ENCOUNTER — Encounter: Payer: Self-pay | Admitting: Neurology

## 2019-11-05 ENCOUNTER — Ambulatory Visit: Payer: Medicare Other | Admitting: Neurology

## 2019-11-05 ENCOUNTER — Other Ambulatory Visit: Payer: Self-pay

## 2019-11-05 VITALS — BP 134/71 | HR 57 | Temp 97.5°F | Ht 69.0 in | Wt 167.5 lb

## 2019-11-05 DIAGNOSIS — I214 Non-ST elevation (NSTEMI) myocardial infarction: Secondary | ICD-10-CM | POA: Diagnosis not present

## 2019-11-05 DIAGNOSIS — R208 Other disturbances of skin sensation: Secondary | ICD-10-CM

## 2019-11-05 DIAGNOSIS — G629 Polyneuropathy, unspecified: Secondary | ICD-10-CM

## 2019-11-05 DIAGNOSIS — I779 Disorder of arteries and arterioles, unspecified: Secondary | ICD-10-CM

## 2019-11-05 MED ORDER — LAMOTRIGINE 100 MG PO TABS: 50.0000 mg | ORAL_TABLET | Freq: Two times a day (BID) | ORAL | 4 refills | Status: DC

## 2019-11-05 NOTE — Progress Notes (Signed)
[facility]  PATIENT: Mark Garner DOB: 28-May-1946  REFERRING DOCTOR OR PCP:  Dr. Arnoldo Morale and Dr. Brien Few ([facility]), PCP is Dr. Lauretta Grill [name]) SOURCE: Patient, notes from Dr. Arnoldo Morale, NCV study from Dr. Brien Few, lab results, MRI results and images.  _________________________________   HISTORICAL  CHIEF COMPLAINT:  Chief Complaint  Patient presents with  . Follow-up    RM 13, alone. Last seen 10/31/2018. Neuropathy unchanged. Has increased joint pain. Being treated for heart issues.    HISTORY OF PRESENT ILLNESS:  Mark Garner is a 74 y.o. man with polyneuropathy and numbness/tingling in the feet and lower legs.  Update 11/05/2019: He feels his neuropathy is stable.    He notes no pain.  Numbness is just in the foot and sometimes up to the proximal ankle.    He is on lamotrigine 50 mg po bid and tolerates that well.  He had a small MI in June and is trying to exercise more.    He will note some pain in the large toe on the left but notes he has a hammer toe.  As part of the evaluation, he had a carotid ultrasound.  Stenosis on the right is about 50% in the distal CCA but less than 39% in the internal carotid artery.  On the left, stenosis was difficult to calculate due to plaque but was felt to be between 1 to 39% based on velocities.  He is exercising more.  He just got his second vaccination for COVID-19 and plans to start working out in the gym again soon.  Cardiology has recommended that he limit his heart rate to 120.   Update 10/31/2018: He has neuropathic pain in his feet up to the ankle but not higher.   This is unchanged compared to the 10/31/2017 visit.   Lamotrigine 50 mg has reduced the pain and he still notes tingling.    A higher dose (100 mg po bid) was not well tolerated.      He had a concussion 6 years ago and has had decreased hearing (Right worse than left), smell and taste.  He was unresponsive x 20 minutes.   He had imaging at that time.  The  hearing loss has been progressive.   He has a strong FH of hearing loss.    Balance is fine.     Update 10/31/2017: He has had neuropathic pain in his feet since 2017. A nerve conduction/EMG  study that year showed changes consistent with neuropathy.   MRI also showed around 10 narrowing, worse on the left at L5-S1 but no definite nerve root compression. I saw him shortly after the NCV/EMG and check SPEP/IFE, and other blood work and they were normal or negative.   He has been on lamotrigine since January 2018. Pain did improve and he is noting that he feels much less energetic and less motivated. He would like to switch to different medicines.    He notes ting;ing in his feet up towards the knee but not higher.    Being active helps by distraction so he notes it most at night.     He notes mild bilateral hearing loss.  Of note his father, uncle and both brothers have hearing issues.    He also notes reduced taste and smell.      From 01/11/2017: At the last visit, lamotrigine was added and titrated to 100 mg po bid.     His pain has improved about 25-35%.   He  still has tingling in the feet but the pain in his toes is better.      There is no numbness above the knees. There is no numbness in the hands or face.  He has not noticed any weakness.  He has felt slightly dizzy but not dramatically.   He feels very slightly more clumsy and has dropped a few items.    Neuropathy history:   In early 2017,, he began to notice more numbness in the feet and then some numbness in the ankle and lower legs. He denies difficulty with gait or balance. He rides his bicycle less than he used to but still about 100 miles every week. He has nocturia but this has been a stable pattern. He does not have difficulty walking in dim light.  He had L4L5 lumbar surgery 2011 and C6-C7 cervical fusion 2002, both with Dr. Arnoldo Morale.   In the past, he was on gabapentin for radicular pain and noted that it made him sleepy.   He stopped  after a short time.  Data:   Nerve conduction study performed by Dr. Brien Few 08/12/2016.   The nerve conduction study showed absent sural sensory responses, tibial motor slowing in the arms were normal. EMG showed chronic neuropathic changes in the intrinsic foot muscles.  I also reviewed the MRI of the lumbar spine dated 09/10/2009.   There are degenerative changes at L3-L4, L4-L5 and L5-S1. At L4-L5, there is right foraminal narrowing but there does not appear to be nerve root compression. At L5-S1, there is moderate left foraminal narrowing encroaching upon the left L5 nerve root without causing definite compression.   10/2016:  SPEP/IFE, ANA, ESR, B12 were normal or negative.  REVIEW OF SYSTEMS: Constitutional: No fevers, chills, sweats, or change in appetite Eyes: No visual changes, double vision, eye pain Ear, nose and throat: No hearing loss, ear pain, nasal congestion, sore throat Cardiovascular: No chest pain, palpitations Respiratory: No shortness of breath at rest or with exertion.   No wheezes GastrointestinaI: No nausea, vomiting, diarrhea, abdominal pain, fecal incontinence.    Occ GERD  Genitourinary: No dysuria, urinary retention or frequency.  No nocturia. Musculoskeletal: No neck pain, back pain Integumentary: No rash, pruritus, skin lesions Neurological: as above Psychiatric: No depression at this time.  No anxiety Endocrine: No palpitations, diaphoresis, change in appetite, change in weigh or increased thirst Hematologic/Lymphatic: No anemia, purpura, petechiae. Allergic/Immunologic: No itchy/runny eyes, nasal congestion, recent allergic reactions, rashes  ALLERGIES: Allergies  Allergen Reactions  . Statins Other (See Comments)    Myalgias with multiple statins  . Fenofibrate Other (See Comments)    Leg pain  . Gemfibrozil Other (See Comments)    Leg pain  . Niacin And Related     Severe flushing    HOME MEDICATIONS:  Current Outpatient Medications:  .   acetaminophen (TYLENOL) 500 MG tablet, Take 500 mg by mouth every 6 (six) hours as needed (pain). , Disp: , Rfl:  .  apixaban (ELIQUIS) 5 MG TABS tablet, Take 1 tablet (5 mg total) by mouth 2 (two) times daily., Disp: 60 tablet, Rfl: 11 .  clopidogrel (PLAVIX) 75 MG tablet, Take 300 mg (4 tabs) on the first day then 75 mg (1 tab) daily, Disp: 90 tablet, Rfl: 3 .  Evolocumab (REPATHA SURECLICK) 026 MG/ML SOAJ, Inject 1 Dose into the skin every 14 (fourteen) days., Disp: 2 pen, Rfl: 11 .  ezetimibe (ZETIA) 10 MG tablet, TAKE 1 TABLET(10 MG) BY MOUTH DAILY, Disp: 90  tablet, Rfl: 0 .  famotidine (PEPCID) 20 MG tablet, Take 20 mg by mouth daily., Disp: , Rfl:  .  lamoTRIgine (LAMICTAL) 100 MG tablet, Take 0.5 tablets (50 mg total) by mouth 2 (two) times daily., Disp: 90 tablet, Rfl: 4 .  metoprolol tartrate (LOPRESSOR) 25 MG tablet, Take 0.5 tablets (12.5 mg total) by mouth 2 (two) times daily., Disp: 90 tablet, Rfl: 3 .  nitroGLYCERIN (NITROSTAT) 0.4 MG SL tablet, Place 1 tablet (0.4 mg total) under the tongue every 5 (five) minutes as needed for chest pain., Disp: 25 tablet, Rfl: 6 .  Simethicone (GAS-X EXTRA STRENGTH PO), Take 1 capsule by mouth 4 (four) times daily as needed (gas). , Disp: , Rfl:  .  isosorbide mononitrate (IMDUR) 30 MG 24 hr tablet, Take 1 tablet (30 mg total) by mouth daily., Disp: 30 tablet, Rfl: 11  PAST MEDICAL HISTORY: Past Medical History:  Diagnosis Date  . Chest pain   . Chest tightness 04/06/2011  . Dysesthesia 10/19/2016  . Heart murmur   . History of cervical spinal surgery 10/19/2016  . History of lumbosacral spine surgery 10/19/2016  . Hyperlipidemia   . Indigestion   . Non-ST elevation (NSTEMI) myocardial infarction ([place]) 03/15/2019  . Normal echocardiogram 05/31/05   normal LV function; trace MR;trace tricuspid regurgitation  . Normal nuclear stress test 07/26/02   no evidence of ischemia; nml LVF  . Polyneuropathy 10/19/2016  . Right bundle branch block   .  Trace mitral regurgitation by prior echocardiogram   . Trace tricuspid regurgitation by prior echocardiogram   . Unstable angina ([place]) 03/14/2019  . Upper airway cough syndrome 09/12/2014   Followed in Pulmonary clinic/ [facility]/ [place]  - gerd rx 09/12/2014 >>>     PAST SURGICAL HISTORY: Past Surgical History:  Procedure Laterality Date  . CORONARY STENT INTERVENTION N/A 03/14/2019   Procedure: CORONARY STENT INTERVENTION;  Surgeon: [name], MD;  Location: [facility];  Service: Cardiovascular;  Laterality: N/A;  . LEFT HEART CATH AND CORONARY ANGIOGRAPHY N/A 03/14/2019   Procedure: LEFT HEART CATH AND CORONARY ANGIOGRAPHY;  Surgeon: [name], MD;  Location: [facility];  Service: Cardiovascular;  Laterality: N/A;  . SPINE SURGERY     x 2     FAMILY HISTORY: Family History  Problem Relation Age of Onset  . Heart disease Brother 70       MI/ptca    SOCIAL HISTORY:  Social History   Socioeconomic History  . Marital status: Married    Spouse name: Not on file  . Number of children: Not on file  . Years of education: Not on file  . Highest education level: Not on file  Occupational History  . Occupation: Retired  Tobacco Use  . Smoking status: Former Smoker    Packs/day: 1.00    Years: 5.00    Pack years: 5.00    Types: Cigarettes    Quit date: 10/04/1984    Years since quitting: 35.1  . Smokeless tobacco: Never Used  Substance and Sexual Activity  . Alcohol use: No    Alcohol/week: 0.0 standard drinks  . Drug use: No  . Sexual activity: Not on file  Other Topics Concern  . Not on file  Social History Narrative  . Not on file   Social Determinants of Health   Financial Resource Strain:   . Difficulty of Paying Living Expenses: Not on file  Food Insecurity:   . Worried About Charity fundraiser  in the Last Year: Not on file  . Ran Out of Food in the Last Year: Not on file  Transportation Needs:   . Lack of Transportation  (Medical): Not on file  . Lack of Transportation (Non-Medical): Not on file  Physical Activity:   . Days of Exercise per Week: Not on file  . Minutes of Exercise per Session: Not on file  Stress:   . Feeling of Stress : Not on file  Social Connections:   . Frequency of Communication with Friends and Family: Not on file  . Frequency of Social Gatherings with Friends and Family: Not on file  . Attends Religious Services: Not on file  . Active Member of Clubs or Organizations: Not on file  . Attends Archivist Meetings: Not on file  . Marital Status: Not on file  Intimate Partner Violence:   . Fear of Current or Ex-Partner: Not on file  . Emotionally Abused: Not on file  . Physically Abused: Not on file  . Sexually Abused: Not on file     PHYSICAL EXAM  Vitals:   11/05/19 1059  BP: 134/71  Pulse: (!) 57  Temp: (!) 97.5 F (36.4 C)  Weight: 167 lb 8 oz (76 kg)  Height: '5\' 9"'$  (1.753 m)    Body mass index is 24.74 kg/m.   General: The patient is well-developed and well-nourished and in no acute distress   Neurologic Exam  Mental status: The patient is alert and oriented x 3 at the time of the examination. The patient has apparent normal recent and remote memory, with an apparently normal attention span and concentration ability.   Speech is normal.  Cranial nerves: Extraocular movements are full. Facial strength and sensation are normal.   Hearing reduced on his right.    Motor:  Muscle bulk is normal.  Muscle tone is normal. Strength is 5/5 in the arms and legs, including toes .Marland Kitchen   Sensory: Sensory testing is intact to pinprick, soft touch and vibration sensation in the arms/hands.  Relative to the knee, vibratory sensation was 50% at the ankle and  20% at the great toe which is improved.   Touch and pinprick sensation was normal in the feet and legs  Coordination: Cerebellar testing reveals good finger-nose-finger and heel-to-shin bilaterally.  Gait and  station: Station is normal.   Gait is normal. Tandem gait is mildly wide.  The Romberg is negative..   Reflexes: Deep tendon reflexes are symmetric and normal bilaterally in the arms.    DTRs are 2+ at the knees and absent at the ankles.        DIAGNOSTIC DATA (LABS, IMAGING, TESTING) - I reviewed patient records, labs, notes, testing and imaging myself where available.  Lab Results  Component Value Date   WBC 9.5 03/15/2019   HGB 11.9 (L) 03/15/2019   HCT 33.3 (L) 03/15/2019   MCV 86.3 03/15/2019   PLT 245 03/15/2019      Component Value Date/Time   NA 138 03/15/2019 0218   NA 139 03/12/2019 1414   K 3.8 03/15/2019 0218   CL 107 03/15/2019 0218   CO2 20 (L) 03/15/2019 0218   GLUCOSE 85 03/15/2019 0218   BUN 13 03/15/2019 0218   BUN 13 03/12/2019 1414   CREATININE 0.90 03/15/2019 0218   CALCIUM 8.4 (L) 03/15/2019 0218   PROT 5.8 (L) 03/15/2019 0218   PROT 6.5 10/31/2018 1203   ALBUMIN 3.3 (L) 03/15/2019 0218   AST 20 03/15/2019 8916  ALT 22 03/15/2019 0218   ALKPHOS 40 03/15/2019 0218   BILITOT 0.7 03/15/2019 0218   GFRNONAA >60 03/15/2019 0218   GFRAA >60 03/15/2019 0218   Lab Results  Component Value Date   CHOL 90 (L) 06/26/2019   HDL 36 (L) 06/26/2019   LDLCALC 31 06/26/2019   LDLDIRECT 80.7 06/14/2014   TRIG 128 06/26/2019   CHOLHDL 2.5 06/26/2019      ASSESSMENT AND PLAN  Polyneuropathy  Dysesthesia  Right-sided carotid artery disease, unspecified type (HCC)  Non-ST elevation (NSTEMI) myocardial infarction ([place])  1.   He has a large fiber sensory neuropathy.  Continue lamotrigine for dysesthesias at 50 mg twice daily.  This dose can be increased if needed.   2.  I reviewed the carotid Doppler study.  Although estimating stenosis on the left is difficult due to shadowing from calcified plaque, the velocities are consistent with mild stenosis.  Due to his heart he will be continuing on antiplatelet therapy..   3.    Continue exercise with limits  recommended by cardiology.   4.    rtc 1 year or sooner if problems.  We discussed that if his primary care physician is able to write the lamotrigine we can just make the follow-up as needed.   Arihanna Estabrook A. Felecia Shelling, MD, PhD 01/05/3153, 00:86 AM Certified in Neurology, [place] [facility], Sleep Medicine, Pain Medicine and Neuroimaging  [facility] [address], [place], [place] [address] [phone]

## 2019-11-08 ENCOUNTER — Telehealth: Payer: Self-pay | Admitting: Cardiovascular Disease

## 2019-11-08 MED ORDER — LOSARTAN POTASSIUM 50 MG PO TABS: 50.0000 mg | ORAL_TABLET | Freq: Every day | ORAL | 11 refills | Status: DC

## 2019-11-08 NOTE — Telephone Encounter (Signed)
Spoke with patient who states he has felt unstable, dizzy, and right eye blurred vision for two days. His BP has been elevated over those 2 days; states HR ususally 60s bpm during the day and in range of 40 bpm during the night.  He is concerned about past hx of carotid results. States left side of neck hurting x 10 days, no problems on right. Blurred vision is in right eye. I asked about sodium in diet and he states he has been consistent w/low sodium except one day of eating [place] food with soy sauce. I advised him that Dr. Acie Fredrickson wants him to start losartan 50 mg once daily and monitor BP and symptoms, particularly after 5 days of therapy. I advised him to call if symptoms do not improve. He is scheduled for office visit on 2/24 and is aware we will check kidney function and electrolytes at that visit. He agrees to call back with questions or concerns prior to that time. He thanked me for the call.

## 2019-11-08 NOTE — Telephone Encounter (Signed)
Pt c/o BP issue: STAT if pt c/o blurred vision, one-sided weakness or slurred speech  1. What are your last 5 BP readings?  On phone: 165/88 HR 62, 174/92, 142/82  169/93 145/80 165/90  2. Are you having any other symptoms (ex. Dizziness, headache, blurred vision, passed out)? Dizziness and mild balance issues. Occasionally watching TV he gets double vision in his R eye  3. What is your BP issue? Pt has had irregular BP readings. He had a mild heart attack last June and had 3 stents put in. He just wants to   He had a carotid ultrasound and there was calcified plaque in one of the carotid arteries, so they were not able to get a good reading.  He just wanted to know if he needed to see Dr. Acie Fredrickson of not

## 2019-11-28 ENCOUNTER — Ambulatory Visit: Payer: Medicare Other | Admitting: Cardiovascular Disease

## 2019-11-28 ENCOUNTER — Encounter: Payer: Self-pay | Admitting: Cardiovascular Disease

## 2019-11-28 ENCOUNTER — Other Ambulatory Visit: Payer: Self-pay

## 2019-11-28 VITALS — BP 128/74 | HR 55 | Ht 69.0 in | Wt 168.4 lb

## 2019-11-28 DIAGNOSIS — I4510 Unspecified right bundle-branch block: Secondary | ICD-10-CM

## 2019-11-28 DIAGNOSIS — I472 Ventricular tachycardia: Secondary | ICD-10-CM | POA: Diagnosis not present

## 2019-11-28 DIAGNOSIS — I214 Non-ST elevation (NSTEMI) myocardial infarction: Secondary | ICD-10-CM

## 2019-11-28 DIAGNOSIS — I200 Unstable angina: Secondary | ICD-10-CM

## 2019-11-28 DIAGNOSIS — I4729 Other ventricular tachycardia: Secondary | ICD-10-CM

## 2019-11-28 DIAGNOSIS — I480 Paroxysmal atrial fibrillation: Secondary | ICD-10-CM

## 2019-11-28 DIAGNOSIS — E785 Hyperlipidemia, unspecified: Secondary | ICD-10-CM

## 2019-11-28 DIAGNOSIS — I779 Disorder of arteries and arterioles, unspecified: Secondary | ICD-10-CM

## 2019-11-28 LAB — BASIC METABOLIC PANEL
BUN/Creatinine Ratio: 18 (ref 10–24)
BUN: 15 mg/dL (ref 8–27)
CO2: 23 mmol/L (ref 20–29)
Calcium: 9.4 mg/dL (ref 8.6–10.2)
Chloride: 101 mmol/L (ref 96–106)
Creatinine, Ser: 0.85 mg/dL (ref 0.76–1.27)
GFR calc Af Amer: 99 mL/min/{1.73_m2} (ref >59)
GFR calc non Af Amer: 85 mL/min/{1.73_m2} (ref >59)
Glucose: 91 mg/dL (ref 65–99)
Potassium: 4.4 mmol/L (ref 3.5–5.2)
Sodium: 135 mmol/L (ref 134–144)

## 2019-11-28 NOTE — Patient Instructions (Addendum)
Your physician recommends that you continue on your current medications as directed. Please refer to the Current Medication list given to you today.   : Your physician recommends that you return for lab work in:  BMET  [place]  ( LIPID AND LIVER  April 7 )   Your physician has requested that you have a carotid duplex. This test is an ultrasound of the carotid arteries in your neck. It looks at blood flow through these arteries that supply the brain with blood. Allow one hour for this exam. There are no restrictions or special instructions. 6 MONTHS WEEK BEFORE APPT  Your physician wants you to follow-up in: [place] will receive a reminder letter in the mail two months in advance. If you don't receive a letter, please call our office to schedule the follow-up appointment.
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
## 2019-12-05 ENCOUNTER — Other Ambulatory Visit: Payer: Self-pay | Admitting: Neurology

## 2020-01-08 ENCOUNTER — Other Ambulatory Visit: Payer: Medicare Other

## 2020-01-08 ENCOUNTER — Other Ambulatory Visit: Payer: Self-pay | Admitting: Cardiovascular Disease

## 2020-01-08 ENCOUNTER — Other Ambulatory Visit: Payer: Self-pay

## 2020-01-08 LAB — HEPATIC FUNCTION PANEL
ALT: 12 IU/L (ref 0–44)
AST: 17 IU/L (ref 0–40)
Albumin: 4.3 g/dL (ref 3.7–4.7)
Alkaline Phosphatase: 49 IU/L (ref 39–117)
Bilirubin Total: 0.4 mg/dL (ref 0.0–1.2)
Bilirubin, Direct: 0.12 mg/dL (ref 0.00–0.40)
Total Protein: 6.3 g/dL (ref 6.0–8.5)

## 2020-01-08 LAB — LIPID PANEL
Chol/HDL Ratio: 2.6 ratio (ref 0.0–5.0)
Cholesterol, Total: 95 mg/dL — ABNORMAL LOW (ref 100–199)
HDL: 36 mg/dL — ABNORMAL LOW (ref >39)
LDL Chol Calc (NIH): 31 mg/dL (ref 0–99)
Triglycerides: 166 mg/dL — ABNORMAL HIGH (ref 0–149)
VLDL Cholesterol Cal: 28 mg/dL (ref 5–40)

## 2020-03-05 ENCOUNTER — Other Ambulatory Visit: Payer: Self-pay

## 2020-03-05 MED ORDER — METOPROLOL TARTRATE 25 MG PO TABS: 12.5000 mg | ORAL_TABLET | Freq: Two times a day (BID) | ORAL | 2 refills | Status: DC

## 2020-03-05 MED ORDER — CLOPIDOGREL BISULFATE 75 MG PO TABS: ORAL_TABLET | ORAL | 8 refills | Status: DC

## 2020-03-05 MED ORDER — ISOSORBIDE MONONITRATE ER 30 MG PO TB24: 30.0000 mg | ORAL_TABLET | Freq: Every day | ORAL | 8 refills | Status: DC

## 2020-03-05 NOTE — Addendum Note (Signed)
Addended by: Carter Kitten D on: 03/05/2020 12:54 PM   Modules accepted: Orders

## 2020-04-13 ENCOUNTER — Encounter: Payer: Self-pay | Admitting: Cardiovascular Disease
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
## 2020-04-14 ENCOUNTER — Encounter: Payer: Self-pay | Admitting: Cardiovascular Disease

## 2020-04-14 ENCOUNTER — Ambulatory Visit: Payer: Medicare Other | Admitting: Cardiovascular Disease

## 2020-04-14 ENCOUNTER — Other Ambulatory Visit: Payer: Self-pay

## 2020-04-14 VITALS — BP 120/64 | HR 56 | Ht 69.0 in | Wt 169.8 lb

## 2020-04-14 DIAGNOSIS — I779 Disorder of arteries and arterioles, unspecified: Secondary | ICD-10-CM

## 2020-04-14 DIAGNOSIS — Z7901 Long term (current) use of anticoagulants: Secondary | ICD-10-CM | POA: Diagnosis not present

## 2020-04-14 DIAGNOSIS — I480 Paroxysmal atrial fibrillation: Secondary | ICD-10-CM

## 2020-04-14 DIAGNOSIS — I25118 Atherosclerotic heart disease of native coronary artery with other forms of angina pectoris: Secondary | ICD-10-CM | POA: Diagnosis not present

## 2020-04-14 DIAGNOSIS — I2510 Atherosclerotic heart disease of native coronary artery without angina pectoris: Secondary | ICD-10-CM | POA: Insufficient documentation

## 2020-04-14 LAB — CBC
Hematocrit: 38.2 % (ref 37.5–51.0)
Hemoglobin: 13.5 g/dL (ref 13.0–17.7)
MCH: 31.8 pg (ref 26.6–33.0)
MCHC: 35.3 g/dL (ref 31.5–35.7)
MCV: 90 fL (ref 79–97)
Platelets: 302 10*3/uL (ref 150–450)
RBC: 4.25 x10E6/uL (ref 4.14–5.80)
RDW: 12.8 % (ref 11.6–15.4)
WBC: 7.8 10*3/uL (ref 3.4–10.8)

## 2020-04-14 LAB — BASIC METABOLIC PANEL
BUN/Creatinine Ratio: 17 (ref 10–24)
BUN: 15 mg/dL (ref 8–27)
CO2: 25 mmol/L (ref 20–29)
Calcium: 9.6 mg/dL (ref 8.6–10.2)
Chloride: 102 mmol/L (ref 96–106)
Creatinine, Ser: 0.87 mg/dL (ref 0.76–1.27)
GFR calc Af Amer: 98 mL/min/{1.73_m2} (ref >59)
GFR calc non Af Amer: 84 mL/min/{1.73_m2} (ref >59)
Glucose: 90 mg/dL (ref 65–99)
Potassium: 4.9 mmol/L (ref 3.5–5.2)
Sodium: 139 mmol/L (ref 134–144)

## 2020-04-14 MED ORDER — APIXABAN 5 MG PO TABS: 5.0000 mg | ORAL_TABLET | Freq: Two times a day (BID) | ORAL | 3 refills | Status: DC

## 2020-04-14 NOTE — Patient Instructions (Signed)
Medication Instructions:  Your physician recommends that you continue on your current medications as directed. Please refer to the Current Medication list given to you today.  *If you need a refill on your cardiac medications before your next appointment, please call your pharmacy*   Lab Work: TODAY - bmet, cbc If you have labs (blood work) drawn today and your tests are completely normal, you will receive your results only by: Marland Kitchen MyChart Message (if you have MyChart) OR . A paper copy in the mail If you have any lab test that is abnormal or we need to change your treatment, we will call you to review the results.   Testing/Procedures: None Ordered   Follow-Up: At [facility], you and your health needs are our priority.  As part of our continuing mission to provide you with exceptional heart care, we have created designated Provider Care Teams.  These Care Teams include your primary Cardiologist (physician) and Advanced Practice Providers (APPs -  Physician Assistants and Nurse Practitioners) who all work together to provide you with the care you need, when you need it.   Your next appointment:   6 month(s)  The format for your next appointment:   In Person  Provider:   You may see Mertie Moores, MD or one of the following Advanced Practice Providers on your designated Care Team:    Richardson Dopp, PA-C  [place], [place]

## 2020-04-22 ENCOUNTER — Other Ambulatory Visit: Payer: Self-pay | Admitting: Internal Medicine

## 2020-06-12 ENCOUNTER — Ambulatory Visit (HOSPITAL_COMMUNITY)
Admission: RE | Admit: 2020-06-12 | Discharge: 2020-06-12 | Disposition: A | Discharge status: Home / Self Care | Payer: Medicare Other | Source: Ambulatory Visit | Attending: Cardiovascular Disease | Admitting: Cardiovascular Disease

## 2020-06-12 ENCOUNTER — Other Ambulatory Visit: Payer: Self-pay

## 2020-06-12 DIAGNOSIS — R0989 Other specified symptoms and signs involving the circulatory and respiratory systems: Secondary | ICD-10-CM

## 2020-06-12 DIAGNOSIS — I779 Disorder of arteries and arterioles, unspecified: Secondary | ICD-10-CM | POA: Diagnosis not present

## 2020-06-16 ENCOUNTER — Other Ambulatory Visit: Payer: Self-pay | Admitting: *Deleted

## 2020-06-16 DIAGNOSIS — I779 Disorder of arteries and arterioles, unspecified: Secondary | ICD-10-CM

## 2020-07-15 ENCOUNTER — Ambulatory Visit: Payer: Medicare Other | Attending: Internal Medicine

## 2020-07-15 DIAGNOSIS — Z23 Encounter for immunization: Secondary | ICD-10-CM

## 2020-07-15 NOTE — Progress Notes (Signed)
   Covid-19 Vaccination Clinic  Name:  ZUHAIR LARICCIA    MRN: [id number] DOB: Dec 12, 1945  07/15/2020  Mr. Kolbeck was observed post Covid-19 immunization for 15 minutes without incident. He was provided with Vaccine Information Sheet and instruction to access the V-Safe system.   Mr. Pulliam was instructed to call 911 with any severe reactions post vaccine: Marland Kitchen Difficulty breathing  . Swelling of face and throat  . A fast heartbeat  . A bad rash all over body  . Dizziness and weakness

## 2020-09-01 ENCOUNTER — Telehealth: Payer: Self-pay | Admitting: Cardiovascular Disease

## 2020-09-01 NOTE — Telephone Encounter (Signed)
Agree with note by Michelle Swinyer, RN  

## 2020-09-01 NOTE — Telephone Encounter (Signed)
Patient c/o Palpitations:  High priority if patient c/o lightheadedness, shortness of breath, or chest pain  1) How long have you had palpitations/irregular HR/ Afib? Are you having the symptoms now? Started 11/27 around 9:00 PM ended around 10:00 PM and has not reoccurred   2) Are you currently experiencing lightheadedness, SOB or CP? No   3) Do you have a history of afib (atrial fibrillation) or irregular heart rhythm? No   4) Have you checked your BP or HR? (document readings if available): HR 130-140 while sitting on couch at the time of Afib   5) Are you experiencing any other symptoms? Tingling all over and neck pain  Mark Garner is calling stating on Saturday 08/30/20 around 9:00 pm he started experiencing tingling all over his body as well as neck pain while sitting on the couch. He states he wears a fit band and when he checked it his HR was ranging between 130-140. He states he called into our office and a physician called him back advising he take an extra metoprolol. Mark Garner reports after taking the additional metoprolol the episode ended around 10:00 Pm and has not occurred since, but he wanted to make Dr. Acie Fredrickson aware. Please advise.

## 2020-09-01 NOTE — Telephone Encounter (Signed)
Returned call to patient who states he had episode of PAF on Saturday night which resolved after taking an additional dose of metoprolol as advised by on-call provider. He has not had any problems since that time or prior to Saturday. He states he had 2 glasses of wine instead of one and is aware that the additional alcohol may have triggered the PAF. He typically drinks 1 glass of wine per week. He reports increased fatigue over last 3-4 weeks but reports he has been helping a friend decorate his large home for Christmas. States he feels less fatigued today. Denies sleep disturbance, chest discomfort, or SOB. I advised him to continue to monitor symptoms and if he has questions or concerns prior to appointment with Dr. Acie Fredrickson in January to call back to discuss. He verbalized understanding and agreement with plan and thanked me for the call.

## 2020-09-15 ENCOUNTER — Telehealth: Payer: Self-pay | Admitting: Internal Medicine

## 2020-09-15 NOTE — Telephone Encounter (Signed)
PA for repatha submitted via CMM (Key: [id number])  Patient last seen in lipid clinic July 2020 - due for an appointment + labs

## 2020-09-18 NOTE — Telephone Encounter (Signed)
Request Reference Number: [id number]. REPATHA SURE INJ 140MG /ML is approved through 10/03/2021

## 2020-10-24 ENCOUNTER — Ambulatory Visit: Payer: Medicare Other | Admitting: Cardiovascular Disease

## 2020-10-24 ENCOUNTER — Other Ambulatory Visit: Payer: Self-pay

## 2020-10-24 ENCOUNTER — Encounter: Payer: Self-pay | Admitting: Cardiovascular Disease

## 2020-10-24 VITALS — BP 118/72 | HR 50 | Ht 69.0 in | Wt 169.0 lb

## 2020-10-24 DIAGNOSIS — I480 Paroxysmal atrial fibrillation: Secondary | ICD-10-CM

## 2020-10-24 DIAGNOSIS — I25118 Atherosclerotic heart disease of native coronary artery with other forms of angina pectoris: Secondary | ICD-10-CM
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
## 2020-10-24 NOTE — Patient Instructions (Signed)
Medication Instructions:  The current medical regimen is effective;  continue present plan and medications.  *If you need a refill on your cardiac medications before your next appointment, please call your pharmacy*  Follow-Up: At [facility], you and your health needs are our priority.  As part of our continuing mission to provide you with exceptional heart care, we have created designated Provider Care Teams.  These Care Teams include your primary Cardiologist (physician) and Advanced Practice Providers (APPs -  Physician Assistants and Nurse Practitioners) who all work together to provide you with the care you need, when you need it.  We recommend signing up for the patient portal called "MyChart".  Sign up information is provided on this After Visit Summary.  MyChart is used to connect with patients for Virtual Visits (Telemedicine).  Patients are able to view lab/test results, encounter notes, upcoming appointments, etc.  Non-urgent messages can be sent to your provider as well.   To learn more about what you can do with MyChart, go to [email or web address].    Your next appointment:   12 month(s)  The format for your next appointment:   In Person  Provider:   Philip Nahser, MD  Thank you for choosing [facility]!!     

## 2020-11-05 ENCOUNTER — Ambulatory Visit: Payer: Medicare Other | Admitting: Neurology

## 2020-11-05 ENCOUNTER — Encounter: Payer: Self-pay | Admitting: Neurology

## 2020-11-05 ENCOUNTER — Other Ambulatory Visit: Payer: Self-pay

## 2020-11-05 VITALS — BP 155/79 | HR 58 | Ht 69.0 in | Wt 172.0 lb

## 2020-11-05 DIAGNOSIS — I779 Disorder of arteries and arterioles, unspecified: Secondary | ICD-10-CM

## 2020-11-05 DIAGNOSIS — R208 Other disturbances of skin sensation: Secondary | ICD-10-CM

## 2020-11-05 DIAGNOSIS — G629 Polyneuropathy, unspecified: Secondary | ICD-10-CM

## 2020-11-05 MED ORDER — LAMOTRIGINE 100 MG PO TABS: 50.0000 mg | ORAL_TABLET | Freq: Two times a day (BID) | ORAL | 4 refills | Status: DC

## 2020-11-05 NOTE — Progress Notes (Signed)
[facility]  PATIENT: Mark Garner DOB: 28-Mar-1946  REFERRING DOCTOR OR PCP:  Dr. Arnoldo Morale and Dr. Brien Few ([facility]), PCP is Dr. Lauretta Grill [name]) SOURCE: Patient, notes from Dr. Arnoldo Morale, NCV study from Dr. Brien Few, lab results, MRI results and images.  _________________________________   HISTORICAL  CHIEF COMPLAINT:  Chief Complaint  Patient presents with  . Follow-up    RM 12, alone. Last seen 11/05/2019. Here to follow up on polyneuropathy/on lamotrigine.  Reports double vision/right sided facial weakness. Only has double vision randomly when watching tv or sometimes when he looks down.    HISTORY OF PRESENT ILLNESS:  Mark Garner is a 75 y.o. man with polyneuropathy and numbness/tingling in the feet and lower legs.  Update 11/05/2020: He feels his neuropathic dysesthesia is a little worse with a pins/needles sensation.   He notes symptoms more while laying down and less when active.    He is on lamotrigine 50 mg po bid and tolerates that well.  No rashes.   He had a small MI in June 2020 and is trying to exercise more.   He saw cardiology recently and was told he is stable.   As part of the cardiac evaluation, he had a carotid ultrasound.  Stenosis on the right is about 50% in the distal CCA but less than 39% in the internal carotid artery.  On the left, stenosis was difficult to calculate due to plaque but was felt to be between 1 to 39% based on velocities.   He is on Eliquis and Plavix for the time being  (had stents and placed on stentds) but one drug may be stopped.   He had one incident of AFIb without rapid heart rate.     He is exercising more.  He has vaccination for COVID-19 and booster.     He has a reduced nasolabial fold on the right noted 6 months ago.  No forehead weakness.  He rarely drools on the right.      Has had a couple episodes of choking while swallowing.      He notes mild bilateral hearing loss.  Of note his father, uncle and both brothers  have hearing issues.    He also notes reduced taste and smell.    Data:   Nerve conduction study performed by Dr. Brien Few 08/12/2016.   The nerve conduction study showed absent sural sensory responses, tibial motor slowing in the arms were normal. EMG showed chronic neuropathic changes in the intrinsic foot muscles.  I also reviewed the MRI of the lumbar spine dated 09/10/2009.   There are degenerative changes at L3-L4, L4-L5 and L5-S1. At L4-L5, there is right foraminal narrowing but there does not appear to be nerve root compression. At L5-S1, there is moderate left foraminal narrowing encroaching upon the left L5 nerve root without causing definite compression.    Labs1/2018:  SPEP/IFE, ANA, ESR, B12 were normal or negative.   MRI also showed around narrowing, worse on the left at L5-S1 but no definite nerve root compression.     REVIEW OF SYSTEMS: Constitutional: No fevers, chills, sweats, or change in appetite Eyes: No visual changes, double vision, eye pain Ear, nose and throat: No hearing loss, ear pain, nasal congestion, sore throat Cardiovascular: No chest pain, palpitations Respiratory: No shortness of breath at rest or with exertion.   No wheezes GastrointestinaI: No nausea, vomiting, diarrhea, abdominal pain, fecal incontinence.    Occ GERD  Genitourinary: No dysuria, urinary retention or frequency.  No nocturia. Musculoskeletal: No neck pain, back pain Integumentary: No rash, pruritus, skin lesions Neurological: as above Psychiatric: No depression at this time.  No anxiety Endocrine: No palpitations, diaphoresis, change in appetite, change in weigh or increased thirst Hematologic/Lymphatic: No anemia, purpura, petechiae. Allergic/Immunologic: No itchy/runny eyes, nasal congestion, recent allergic reactions, rashes  ALLERGIES: Allergies  Allergen Reactions  . Statins Other (See Comments)    Myalgias with multiple statins  . Fenofibrate Other (See Comments)    Leg pain  .  Gemfibrozil Other (See Comments)    Leg pain  . Niacin And Related     Severe flushing    HOME MEDICATIONS:  Current Outpatient Medications:  .  acetaminophen (TYLENOL) 500 MG tablet, Take 500 mg by mouth every 6 (six) hours as needed (pain)., Disp: , Rfl:  .  apixaban (ELIQUIS) 5 MG TABS tablet, Take 1 tablet (5 mg total) by mouth 2 (two) times daily., Disp: 180 tablet, Rfl: 3 .  clopidogrel (PLAVIX) 75 MG tablet, Take 300 mg (4 tabs) on the first day then 75 mg (1 tab) daily, Disp: 30 tablet, Rfl: 8 .  famotidine (PEPCID) 20 MG tablet, Take 20 mg by mouth daily., Disp: , Rfl:  .  isosorbide mononitrate (IMDUR) 30 MG 24 hr tablet, Take 1 tablet (30 mg total) by mouth daily., Disp: 30 tablet, Rfl: 8 .  losartan (COZAAR) 50 MG tablet, Take 1 tablet (50 mg total) by mouth daily., Disp: 30 tablet, Rfl: 11 .  metoprolol tartrate (LOPRESSOR) 25 MG tablet, Take 0.5 tablets (12.5 mg total) by mouth 2 (two) times daily., Disp: 90 tablet, Rfl: 2 .  nitroGLYCERIN (NITROSTAT) 0.4 MG SL tablet, Place 1 tablet (0.4 mg total) under the tongue every 5 (five) minutes as needed for chest pain., Disp: 25 tablet, Rfl: 6 .  REPATHA SURECLICK 244 MG/ML SOAJ, INJECT 1 DOSE INTO THE SKIN EVERY 14 DAYS, Disp: 2 pen, Rfl: 11 .  Simethicone (GAS-X EXTRA STRENGTH PO), Take 1 capsule by mouth 4 (four) times daily as needed (gas)., Disp: , Rfl:  .  lamoTRIgine (LAMICTAL) 100 MG tablet, Take 0.5 tablets (50 mg total) by mouth 2 (two) times daily., Disp: 90 tablet, Rfl: 4  PAST MEDICAL HISTORY: Past Medical History:  Diagnosis Date  . Chest pain   . Chest tightness 04/06/2011  . Dysesthesia 10/19/2016  . Heart murmur   . History of cervical spinal surgery 10/19/2016  . History of lumbosacral spine surgery 10/19/2016  . Hyperlipidemia   . Indigestion   . Non-ST elevation (NSTEMI) myocardial infarction ([place]) 03/15/2019  . Normal echocardiogram 05/31/05   normal LV function; trace MR;trace tricuspid regurgitation  .  Normal nuclear stress test 07/26/02   no evidence of ischemia; nml LVF  . Polyneuropathy 10/19/2016  . Right bundle branch block   . Trace mitral regurgitation by prior echocardiogram   . Trace tricuspid regurgitation by prior echocardiogram   . Unstable angina ([place]) 03/14/2019  . Upper airway cough syndrome 09/12/2014   Followed in Pulmonary clinic/ [facility]/ [place]  - gerd rx 09/12/2014 >>>     PAST SURGICAL HISTORY: Past Surgical History:  Procedure Laterality Date  . CORONARY STENT INTERVENTION N/A 03/14/2019   Procedure: CORONARY STENT INTERVENTION;  Surgeon: [name], MD;  Location: [place] CV LAB;  Service: Cardiovascular;  Laterality: N/A;  . LEFT HEART CATH AND CORONARY ANGIOGRAPHY N/A 03/14/2019   Procedure: LEFT HEART CATH AND CORONARY ANGIOGRAPHY;  Surgeon: [name], MD;  Location: [place] CV  LAB;  Service: Cardiovascular;  Laterality: N/A;  . SPINE SURGERY     x 2     FAMILY HISTORY: Family History  Problem Relation Age of Onset  . Heart disease Brother 8       MI/ptca    SOCIAL HISTORY:  Social History   Socioeconomic History  . Marital status: Married    Spouse name: Not on file  . Number of children: Not on file  . Years of education: Not on file  . Highest education level: Not on file  Occupational History  . Occupation: Retired  Tobacco Use  . Smoking status: Former Smoker    Packs/day: 1.00    Years: 5.00    Pack years: 5.00    Types: Cigarettes    Quit date: 10/04/1984    Years since quitting: 36.1  . Smokeless tobacco: Never Used  Substance and Sexual Activity  . Alcohol use: No    Alcohol/week: 0.0 standard drinks  . Drug use: No  . Sexual activity: Not on file  Other Topics Concern  . Not on file  Social History Narrative  . Not on file   Social Determinants of Health   Financial Resource Strain: Not on file  Food Insecurity: Not on file  Transportation Needs: Not on file  Physical Activity: Not on file   Stress: Not on file  Social Connections: Not on file  Intimate Partner Violence: Not on file     PHYSICAL EXAM  Vitals:   11/05/20 1002  BP: (!) 155/79  Pulse: (!) 58  Weight: 172 lb (78 kg)  Height: 5' 9"  (1.753 m)    Body mass index is 25.4 kg/m.   General: The patient is well-developed and well-nourished and in no acute distress   Neurologic Exam  Mental status: The patient is alert and oriented x 3 at the time of the examination. The patient has apparent normal recent and remote memory, with an apparently normal attention span and concentration ability.   Speech is normal.  Cranial nerves: Extraocular movements are full. Facial strength and sensation are normal.   Hearing reduced on his right.    Motor:  Muscle bulk is normal.  Muscle tone is normal. Strength is 5/5 in the arms and legs, including toes .Marland Kitchen   Sensory: Sensory testing is intact to pinprick, soft touch and vibration sensation in the arms/hands.  Relative to the knee, vibratory sensation was 80-100% at the ankle and  10-20% at the great toe.   Touch and pinprick sensation was normal in the feet and legs  Coordination: Cerebellar testing reveals good finger-nose-finger and heel-to-shin bilaterally.  Gait and station: Station is normal.   Gait is normal. Tandem gait is mildly wide.  The Romberg is negative..   Reflexes: Deep tendon reflexes are symmetric and normal bilaterally in the arms.    DTRs are 2+ at the knees and absent at the ankles.        DIAGNOSTIC DATA (LABS, IMAGING, TESTING) - I reviewed patient records, labs, notes, testing and imaging myself where available.  Lab Results  Component Value Date   WBC 7.8 04/14/2020   HGB 13.5 04/14/2020   HCT 38.2 04/14/2020   MCV 90 04/14/2020   PLT 302 04/14/2020      Component Value Date/Time   NA 139 04/14/2020 1059   K 4.9 04/14/2020 1059   CL 102 04/14/2020 1059   CO2 25 04/14/2020 1059   GLUCOSE 90 04/14/2020 1059   GLUCOSE 85 03/15/2019  0218   BUN 15 04/14/2020 1059   CREATININE 0.87 04/14/2020 1059   CALCIUM 9.6 04/14/2020 1059   PROT 6.3 01/08/2020 0932   ALBUMIN 4.3 01/08/2020 0932   AST 17 01/08/2020 0932   ALT 12 01/08/2020 0932   ALKPHOS 49 01/08/2020 0932   BILITOT 0.4 01/08/2020 0932   GFRNONAA 84 04/14/2020 1059   GFRAA 98 04/14/2020 1059   Lab Results  Component Value Date   CHOL 95 (L) 01/08/2020   HDL 36 (L) 01/08/2020   LDLCALC 31 01/08/2020   LDLDIRECT 80.7 06/14/2014   TRIG 166 (H) 01/08/2020   CHOLHDL 2.6 01/08/2020      ASSESSMENT AND PLAN  Polyneuropathy  Dysesthesia  Right-sided carotid artery disease, unspecified type ([place])  1.  Continue lamotrigine for dysesthesias associated with the large fiber sensory neuropathy.  We discussed that if the dysesthesias worsen we could increase the dose.  Currently he will continue lamotrigine 50 mg twice daily (takes one half of the 100 mg pill twice a day) 2.  Unclear whether the very minimal right-sided facial weakness is due to a partial Bell's palsy or to a very small stroke.  Either way, I would have expected a complete resolution and symptoms have been stable so etiology is uncertain.     Continue anticoagulant plus minus antiplatelet agent as directed by cardiology. 3.   Continue exercise with limits recommended by cardiology.   4.    rtc 1 year or sooner if problems.  We discussed that if his primary care physician is able to write the lamotrigine we can just make the follow-up as needed.   Hayslee Casebolt A. Felecia Shelling, MD, PhD 05/05/8002, 4:91 PM Certified in Neurology, Clinical Neurophysiology, Sleep Medicine, Pain Medicine and Neuroimaging  [facility] [address], [place], [place] [address] [phone]

## 2020-11-06 ENCOUNTER — Other Ambulatory Visit: Payer: Self-pay | Admitting: Cardiovascular Disease

## 2020-12-04 ENCOUNTER — Other Ambulatory Visit: Payer: Self-pay | Admitting: Cardiovascular Disease

## 2020-12-22 DIAGNOSIS — L814 Other melanin hyperpigmentation: Secondary | ICD-10-CM | POA: Diagnosis not present

## 2020-12-22 DIAGNOSIS — L821 Other seborrheic keratosis: Secondary | ICD-10-CM | POA: Diagnosis not present

## 2020-12-22 DIAGNOSIS — D1801 Hemangioma of skin and subcutaneous tissue: Secondary | ICD-10-CM | POA: Diagnosis not present

## 2020-12-22 DIAGNOSIS — D485 Neoplasm of uncertain behavior of skin: Secondary | ICD-10-CM | POA: Diagnosis not present

## 2020-12-22 DIAGNOSIS — L819 Disorder of pigmentation, unspecified: Secondary | ICD-10-CM | POA: Diagnosis not present

## 2020-12-22 DIAGNOSIS — L570 Actinic keratosis: Secondary | ICD-10-CM | POA: Diagnosis not present

## 2020-12-30 ENCOUNTER — Other Ambulatory Visit: Payer: Self-pay | Admitting: Cardiovascular Disease

## 2021-01-15 DIAGNOSIS — R7303 Prediabetes: Secondary | ICD-10-CM | POA: Diagnosis not present

## 2021-01-19 ENCOUNTER — Ambulatory Visit: Payer: Medicare Other

## 2021-01-23 ENCOUNTER — Ambulatory Visit: Payer: Medicare Other | Attending: Internal Medicine

## 2021-01-23 DIAGNOSIS — Z23 Encounter for immunization: Secondary | ICD-10-CM

## 2021-01-23 NOTE — Progress Notes (Signed)
   Covid-19 Vaccination Clinic  Name:  Mark Garner    MRN: [id number] DOB: Dec 13, 1945  01/23/2021  [name] was observed post Covid-19 immunization for 15 minutes without incident. He was provided with Vaccine Information Sheet and instruction to access the V-Safe system.   [name] was instructed to call 911 with any severe reactions post vaccine: Marland Kitchen Difficulty breathing  . Swelling of face and throat  . A fast heartbeat  . A bad rash all over body  . Dizziness and weakness   Immunizations Administered    Name Date Dose VIS Date Route   PFIZER Comrnaty(Gray TOP) Covid-19 Vaccine 01/23/2021 11:06 AM 0.3 mL 09/11/2020 Intramuscular   Manufacturer: [employer], [employer]   Lot: [id number]   NDC: [phone]

## 2021-01-26 ENCOUNTER — Other Ambulatory Visit (HOSPITAL_BASED_OUTPATIENT_CLINIC_OR_DEPARTMENT_OTHER): Payer: Self-pay

## 2021-01-26 MED ORDER — PFIZER-BIONT COVID-19 VAC-TRIS 30 MCG/0.3ML IM SUSP
INTRAMUSCULAR | 0 refills | Status: DC
  Filled 2021-01-26: qty 0.3, 1d supply, fill #0

## 2021-02-25 DIAGNOSIS — M7752 Other enthesopathy of left foot: Secondary | ICD-10-CM | POA: Diagnosis not present

## 2021-02-25 DIAGNOSIS — M7742 Metatarsalgia, left foot: Secondary | ICD-10-CM | POA: Diagnosis not present

## 2021-03-09 ENCOUNTER — Other Ambulatory Visit: Payer: Self-pay

## 2021-03-09 MED ORDER — ISOSORBIDE MONONITRATE ER 30 MG PO TB24: 30.0000 mg | ORAL_TABLET | Freq: Every day | ORAL | 6 refills | Status: DC

## 2021-04-14 ENCOUNTER — Other Ambulatory Visit: Payer: Self-pay | Admitting: Internal Medicine

## 2021-04-15 NOTE — Telephone Encounter (Signed)
Rx(s) sent to pharmacy electronically.  

## 2021-04-25 ENCOUNTER — Other Ambulatory Visit: Payer: Self-pay | Admitting: Cardiovascular Disease

## 2021-04-27 NOTE — Telephone Encounter (Signed)
Pt last saw Dr Acie Fredrickson 10/24/20, last labs 07/04/20 Creat 0.86 at [facility] per [facility], Age 75, weight 78kg, based on specified criteria pt is on appropriate dosage of Eliquis '5mg'$  BID.  Will refill rx.

## 2021-05-09 ENCOUNTER — Other Ambulatory Visit: Payer: Self-pay | Admitting: Internal Medicine

## 2021-06-16 ENCOUNTER — Ambulatory Visit (HOSPITAL_COMMUNITY)
Admission: RE | Admit: 2021-06-16 | Discharge: 2021-06-16 | Disposition: A | Discharge status: Home / Self Care | Payer: Medicare Other | Source: Ambulatory Visit | Attending: Internal Medicine | Admitting: Internal Medicine

## 2021-06-16 ENCOUNTER — Other Ambulatory Visit: Payer: Self-pay

## 2021-06-16 ENCOUNTER — Other Ambulatory Visit (HOSPITAL_COMMUNITY): Payer: Self-pay | Admitting: Cardiovascular Disease

## 2021-06-16 DIAGNOSIS — I6521 Occlusion and stenosis of right carotid artery: Secondary | ICD-10-CM

## 2021-06-16 DIAGNOSIS — I6523 Occlusion and stenosis of bilateral carotid arteries: Secondary | ICD-10-CM

## 2021-06-16 DIAGNOSIS — I779 Disorder of arteries and arterioles, unspecified: Secondary | ICD-10-CM | POA: Diagnosis not present

## 2021-06-17 ENCOUNTER — Telehealth: Payer: Self-pay | Admitting: *Deleted

## 2021-06-17 DIAGNOSIS — I6523 Occlusion and stenosis of bilateral carotid arteries: Secondary | ICD-10-CM

## 2021-06-17 DIAGNOSIS — I779 Disorder of arteries and arterioles, unspecified: Secondary | ICD-10-CM

## 2021-06-17 NOTE — Telephone Encounter (Signed)
Pt made aware of carotid US results and recommendations to have this repeated in one year, per Dr. Acie Fredrickson.  Informed the pt that I will go ahead and place the order for repeat carotids in one year in the system, and send a message to our [place], to call him closer to that time to arrange.  Pt verbalized understanding and agrees with this plan.

## 2021-06-17 NOTE — Telephone Encounter (Signed)
-----   Message from Thayer Headings, MD sent at 06/17/2021  6:25 AM EDT ----- RCA - mild carotid disease LCA - moderate carotid disease Repeat carotid duplex in 1 year

## 2021-06-23 ENCOUNTER — Ambulatory Visit: Payer: Medicare Other | Attending: Internal Medicine

## 2021-06-23 DIAGNOSIS — Z23 Encounter for immunization: Secondary | ICD-10-CM

## 2021-06-23 NOTE — Progress Notes (Signed)
   Covid-19 Vaccination Clinic  Name:  Mark Garner    MRN: [id number] DOB: [birth year]  06/23/2021  [name] was observed post Covid-19 immunization for 15 minutes without incident. He was provided with Vaccine Information Sheet and instruction to access the V-Safe system.   [name] was instructed to call 911 with any severe reactions post vaccine: Difficulty breathing  Swelling of face and throat  A fast heartbeat  A bad rash all over body  Dizziness and weakness

## 2021-06-29 ENCOUNTER — Other Ambulatory Visit (HOSPITAL_BASED_OUTPATIENT_CLINIC_OR_DEPARTMENT_OTHER): Payer: Self-pay

## 2021-06-29 MED ORDER — COVID-19MRNA BIVAL VACC PFIZER 30 MCG/0.3ML IM SUSP
INTRAMUSCULAR | 0 refills | Status: DC
  Filled 2021-06-29: qty 0.3, 1d supply, fill #0

## 2021-07-16 DIAGNOSIS — G720 Drug-induced myopathy: Secondary | ICD-10-CM | POA: Diagnosis not present

## 2021-07-16 DIAGNOSIS — Z23 Encounter for immunization: Secondary | ICD-10-CM | POA: Diagnosis not present

## 2021-07-16 DIAGNOSIS — R7303 Prediabetes: Secondary | ICD-10-CM | POA: Diagnosis not present

## 2021-07-16 DIAGNOSIS — I4891 Unspecified atrial fibrillation: Secondary | ICD-10-CM | POA: Diagnosis not present

## 2021-07-16 DIAGNOSIS — E7800 Pure hypercholesterolemia, unspecified: Secondary | ICD-10-CM | POA: Diagnosis not present

## 2021-07-16 DIAGNOSIS — Z0001 Encounter for general adult medical examination with abnormal findings: Secondary | ICD-10-CM | POA: Diagnosis not present

## 2021-07-16 DIAGNOSIS — I2510 Atherosclerotic heart disease of native coronary artery without angina pectoris: Secondary | ICD-10-CM | POA: Diagnosis not present

## 2021-07-16 DIAGNOSIS — Z79899 Other long term (current) drug therapy: Secondary | ICD-10-CM | POA: Diagnosis not present

## 2021-08-05 ENCOUNTER — Telehealth: Payer: Self-pay

## 2021-08-05 MED ORDER — NITROGLYCERIN 0.4 MG SL SUBL: 0.4000 mg | SUBLINGUAL_TABLET | SUBLINGUAL | 3 refills | Status: DC | PRN

## 2021-08-05 NOTE — Telephone Encounter (Signed)
Received a call from the patient stating he is going out of town and would like a refill on his Nitroglycerin to have in case he needs it. He states his old rx has expired.   Chart reviewed.  Rx(s) sent to pharmacy electronically.  Patient voiced understanding.

## 2021-09-07 ENCOUNTER — Other Ambulatory Visit: Payer: Self-pay | Admitting: Cardiovascular Disease

## 2021-10-01 ENCOUNTER — Telehealth: Payer: Self-pay

## 2021-10-01 NOTE — Telephone Encounter (Signed)
PA submitted by [facility], [place]: [id number].

## 2021-10-02 NOTE — Telephone Encounter (Signed)
Patient approved by [facility] [id number] through 10/03/2022.

## 2021-10-06 ENCOUNTER — Encounter: Payer: Self-pay | Admitting: Cardiovascular Disease
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
## 2021-10-07 ENCOUNTER — Encounter: Payer: Self-pay | Admitting: Cardiovascular Disease

## 2021-10-07 ENCOUNTER — Other Ambulatory Visit: Payer: Self-pay

## 2021-10-07 ENCOUNTER — Ambulatory Visit: Payer: Medicare Other | Admitting: Cardiovascular Disease

## 2021-10-07 VITALS — BP 132/70 | HR 63 | Ht 69.0 in | Wt 167.0 lb

## 2021-10-07 DIAGNOSIS — I10 Essential (primary) hypertension: Secondary | ICD-10-CM

## 2021-10-07 DIAGNOSIS — I480 Paroxysmal atrial fibrillation: Secondary | ICD-10-CM | POA: Diagnosis not present

## 2021-10-07 DIAGNOSIS — I2510 Atherosclerotic heart disease of native coronary artery without angina pectoris: Secondary | ICD-10-CM | POA: Diagnosis not present

## 2021-10-07 NOTE — Patient Instructions (Signed)
Medication Instructions:  Your physician recommends that you continue on your current medications as directed. Please refer to the Current Medication list given to you today.  *If you need a refill on your cardiac medications before your next appointment, please call your pharmacy*   Lab Work: NONE If you have labs (blood work) drawn today and your tests are completely normal, you will receive your results only by: [place] (if you have MyChart) OR A paper copy in the mail If you have any lab test that is abnormal or we need to change your treatment, we will call you to review the results.   Testing/Procedures: NONE   Follow-Up: At [facility], you and your health needs are our priority.  As part of our continuing mission to provide you with exceptional heart care, we have created designated Provider Care Teams.  These Care Teams include your primary Cardiologist (physician) and Advanced Practice Providers (APPs -  Physician Assistants and Nurse Practitioners) who all work together to provide you with the care you need, when you need it.   Your next appointment:   1 year(s)  The format for your next appointment:   In Person  Provider:   Mertie Moores, MD  or Robbie Lis, PA-C or Richardson Dopp, PA-C      :1}

## 2021-10-20 ENCOUNTER — Other Ambulatory Visit: Payer: Self-pay | Admitting: Cardiovascular Disease

## 2021-10-27 ENCOUNTER — Other Ambulatory Visit: Payer: Self-pay | Admitting: Cardiovascular Disease

## 2021-10-27 NOTE — Telephone Encounter (Signed)
Eliquis 5 mg refill request received. Patient is 76 years old, weight- 75.8 kg, Crea- 0.87 on 04/14/21, Diagnosis- PAF, and last seen by Dr. Acie Fredrickson on 10/07/21. Dose is appropriate based on dosing criteria. Will send in refill to requested pharmacy.

## 2021-11-03 ENCOUNTER — Other Ambulatory Visit: Payer: Self-pay | Admitting: *Deleted

## 2021-11-03 MED ORDER — LOSARTAN POTASSIUM 50 MG PO TABS: ORAL_TABLET | ORAL | 3 refills | Status: DC

## 2021-11-04 NOTE — Patient Instructions (Signed)
Below is our plan:  We will continue lamotrigine 50mg  twice daily. May consider increasing evening dose if needed.   Please make sure you are staying well hydrated. I recommend 50-60 ounces daily. Well balanced diet and regular exercise encouraged. Consistent sleep schedule with 6-8 hours recommended.   Please continue follow up with care team as directed.   Follow up with [name] in 1 year   You may receive a survey regarding today's visit. I encourage you to leave honest feed back as I do use this information to improve patient care. Thank you for seeing me today!

## 2021-11-04 NOTE — Progress Notes (Signed)
Chief Complaint  Patient presents with   Follow-up    Rm 10, alone. Here for yearly f/u for polyneuropathy. Pt reports sx are about the same. Pt reports slightly worse at night in legs.      HISTORY OF PRESENT ILLNESS:  11/05/21 ALL:  Mark Garner is a 76 y.o. male here today for follow up for polyneuropathy. He continues lamotrigine 46m BID (taking 1/2 of 1054mtab BID). He feels symptoms are stable. He may have worsening pain of toes at night but doesn't feel this is consistent or bothersome at this time. He does note his feel are more sensitive when walking on hot sand. He always wears shoes.   He is followed closely by PCP and cardiology. BP is well managed. On Eliquis and Plavix s/p stent placement of LAD and LCx with afib . Cholesterol improving on Repatha.  He is an active cyclist. He is doing very well. 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    HISTORY (copied from Dr SaGarth Bignessrevious note)  [name] a [age].o. man with polyneuropathy and numbness/tingling in the feet and lower legs.   Update 11/05/2020: He feels his neuropathic dysesthesia is a little worse with a pins/needles sensation.   He notes symptoms more while laying down and less when active.    He is on lamotrigine 50 mg po bid and tolerates that well.  No rashes.    He had a small MI in June 2020 and is trying to exercise more.   He saw cardiology recently and was told he is stable.   As part of the cardiac evaluation, he had a carotid ultrasound.  Stenosis on the right is about 50% in the distal CCA but less than 39% in the internal carotid artery.  On the left, stenosis was difficult to calculate due to plaque but was felt to be between 1 to 39% based on velocities.   He is on Eliquis and Plavix for the time being  (had stents and placed on stentds) but one drug may be stopped.   He had one incident of AFIb without rapid heart rate.      He is exercising more.  He has  vaccination for COVID-19 and booster.      He has a reduced nasolabial fold on the right noted 6 months ago.  No forehead weakness.  He rarely drools on the right.      Has had a couple episodes of choking while swallowing.       He notes mild bilateral hearing loss.  Of note his father, uncle and both brothers have hearing issues.    He also notes reduced taste and smell.     Data:   Nerve conduction study performed by Dr. BaBrien Few1/06/2016.   The nerve conduction study showed absent sural sensory responses, tibial motor slowing in the arms were normal. EMG showed chronic neuropathic changes in the intrinsic foot muscles.  I also reviewed the MRI of the lumbar spine dated 09/10/2009.   There are degenerative changes at L3-L4, L4-L5 and L5-S1. At L4-L5, there is right foraminal narrowing but there does not appear to be nerve root compression. At L5-S1, there is moderate left foraminal narrowing encroaching upon the left L5 nerve root without causing definite compression.    Labs1/2018:  SPEP/IFE, ANA, ESR, B12 were normal or negative.   MRI also showed around narrowing, worse on the left at L5-S1 but no  definite nerve root compression.     REVIEW OF SYSTEMS: Out of a complete 14 system review of symptoms, the patient complains only of the following symptoms, neuropathy of feet, and all other reviewed systems are negative.   ALLERGIES: Allergies  Allergen Reactions   Other     Other reaction(s): Other (See Comments) Myalgias with multiple statins   Statins Other (See Comments)    Myalgias with multiple statins   Amoxicillin     Other reaction(s): GI   Fenofibrate Other (See Comments)    Leg pain   Gemfibrozil Other (See Comments)    Leg pain   Niacin And Related     Severe flushing     HOME MEDICATIONS: Outpatient Medications Prior to Visit  Medication Sig Dispense Refill   acetaminophen (TYLENOL) 500 MG tablet Take 500 mg by mouth every 6 (six) hours as needed (pain).      clopidogrel (PLAVIX) 75 MG tablet Take 1 tablet (75 mg total) by mouth daily. 90 tablet 3   clotrimazole-betamethasone (LOTRISONE) cream 1 application to affected area     COVID-19 mRNA bivalent vaccine, Pfizer, injection Inject into the muscle. 0.3 mL 0   COVID-19 mRNA Vac-TriS, Pfizer, (PFIZER-BIONT COVID-19 VAC-TRIS) SUSP injection Inject into the muscle. 0.3 mL 0   ELIQUIS 5 MG TABS tablet TAKE 1 TABLET(5 MG) BY MOUTH TWICE DAILY 180 tablet 1   famotidine (PEPCID) 20 MG tablet Take 20 mg by mouth daily.     isosorbide mononitrate (IMDUR) 30 MG 24 hr tablet TAKE 1 TABLET(30 MG) BY MOUTH DAILY 90 tablet 3   losartan (COZAAR) 50 MG tablet TAKE 1 TABLET(50 MG) BY MOUTH DAILY 90 tablet 3   metoprolol tartrate (LOPRESSOR) 25 MG tablet TAKE 1/2 TABLET(12.5 MG) BY MOUTH TWICE DAILY 90 tablet 0   nitroGLYCERIN (NITROSTAT) 0.4 MG SL tablet Place 1 tablet (0.4 mg total) under the tongue every 5 (five) minutes as needed for chest pain. 25 tablet 3   REPATHA SURECLICK 242 MG/ML SOAJ INJECT 1 DOSE(140MG) INTO THE SKIN EVERY 14 DAYS 2 mL 11   Simethicone (GAS-X EXTRA STRENGTH PO) Take 1 capsule by mouth 4 (four) times daily as needed (gas).     lamoTRIgine (LAMICTAL) 100 MG tablet Take 0.5 tablets (50 mg total) by mouth 2 (two) times daily. 90 tablet 4   No facility-administered medications prior to visit.     PAST MEDICAL HISTORY: Past Medical History:  Diagnosis Date   Chest pain    Chest tightness 04/06/2011   Dysesthesia 10/19/2016   Heart murmur    History of cervical spinal surgery 10/19/2016   History of lumbosacral spine surgery 10/19/2016   Hyperlipidemia    Indigestion    Non-ST elevation (NSTEMI) myocardial infarction ([place]) 03/15/2019   Normal echocardiogram 05/31/05   normal LV function; trace MR;trace tricuspid regurgitation   Normal nuclear stress test 07/26/02   no evidence of ischemia; nml LVF   Polyneuropathy 10/19/2016   Right bundle branch block    Trace mitral regurgitation by  prior echocardiogram    Trace tricuspid regurgitation by prior echocardiogram    Unstable angina ([place]) 03/14/2019   Upper airway cough syndrome 09/12/2014   Followed in Pulmonary clinic/ [facility]/ [facility]  - gerd rx 09/12/2014 >>>      PAST SURGICAL HISTORY: Past Surgical History:  Procedure Laterality Date   CORONARY STENT INTERVENTION N/A 03/14/2019   Procedure: CORONARY STENT INTERVENTION;  Surgeon: [name], MD;  Location: [facility];  Service:  Cardiovascular;  Laterality: N/A;   LEFT HEART CATH AND CORONARY ANGIOGRAPHY N/A 03/14/2019   Procedure: LEFT HEART CATH AND CORONARY ANGIOGRAPHY;  Surgeon: [name], MD;  Location: [facility];  Service: Cardiovascular;  Laterality: N/A;   SPINE SURGERY     x 2      FAMILY HISTORY: Family History  Problem Relation Age of Onset   Heart disease Brother 62       MI/ptca     SOCIAL HISTORY: Social History   Socioeconomic History   Marital status: Married    Spouse name: Not on file   Number of children: Not on file   Years of education: Not on file   Highest education level: Not on file  Occupational History   Occupation: Retired  Tobacco Use   Smoking status: Former    Packs/day: 1.00    Years: 5.00    Pack years: 5.00    Types: Cigarettes    Quit date: 10/04/1984    Years since quitting: 37.1   Smokeless tobacco: Never  Substance and Sexual Activity   Alcohol use: No    Alcohol/week: 0.0 standard drinks   Drug use: No   Sexual activity: Not on file  Other Topics Concern   Not on file  Social History Narrative   Not on file   Social Determinants of Health   Financial Resource Strain: Not on file  Food Insecurity: Not on file  Transportation Needs: Not on file  Physical Activity: Not on file  Stress: Not on file  Social Connections: Not on file  Intimate Partner Violence: Not on file     PHYSICAL EXAM  Vitals:   11/05/21 1006  BP: 132/66  Pulse: (!) 57  SpO2: 97%   Weight: 166 lb (75.3 kg)  Height: 5' 9"  (1.753 m)   Body mass index is 24.51 kg/m.  Generalized: Well developed, in no acute distress  Cardiology: normal rate and rhythm, no murmur auscultated  Respiratory: clear to auscultation bilaterally    Neurological examination  Mentation: Alert oriented to time, place, history taking. Follows all commands speech and language fluent Cranial nerve II-XII: Pupils were equal round reactive to light. Extraocular movements were full, visual field were full on confrontational test. Facial sensation and strength were normal. Head turning and shoulder shrug  were normal and symmetric. Motor: The motor testing reveals 5 over 5 strength of all 4 extremities. Good symmetric motor tone is noted throughout.  Sensory: Sensory testing is intact to soft touch on all 4 extremities. No evidence of extinction is noted.  Gait and station: Gait is normal.    DIAGNOSTIC DATA (LABS, IMAGING, TESTING) - I reviewed patient records, labs, notes, testing and imaging myself where available.  Lab Results  Component Value Date   WBC 7.8 04/14/2020   HGB 13.5 04/14/2020   HCT 38.2 04/14/2020   MCV 90 04/14/2020   PLT 302 04/14/2020      Component Value Date/Time   NA 139 04/14/2020 1059   K 4.9 04/14/2020 1059   CL 102 04/14/2020 1059   CO2 25 04/14/2020 1059   GLUCOSE 90 04/14/2020 1059   GLUCOSE 85 03/15/2019 0218   BUN 15 04/14/2020 1059   CREATININE 0.87 04/14/2020 1059   CALCIUM 9.6 04/14/2020 1059   PROT 6.3 01/08/2020 0932   ALBUMIN 4.3 01/08/2020 0932   AST 17 01/08/2020 0932   ALT 12 01/08/2020 0932   ALKPHOS 49 01/08/2020 0932   BILITOT 0.4 01/08/2020 0932  GFRNONAA 84 04/14/2020 1059   GFRAA 98 04/14/2020 1059   Lab Results  Component Value Date   CHOL 95 (L) 01/08/2020   HDL 36 (L) 01/08/2020   LDLCALC 31 01/08/2020   LDLDIRECT 80.7 06/14/2014   TRIG 166 (H) 01/08/2020   CHOLHDL 2.6 01/08/2020   No results found for: HGBA1C Lab  Results  Component Value Date   VITAMINB12 1,016 10/19/2016   Lab Results  Component Value Date   TSH 4.319 03/14/2019    No flowsheet data found.   No flowsheet data found.   ASSESSMENT AND PLAN  76 y.o. year old male  has a past medical history of Chest pain, Chest tightness (04/06/2011), Dysesthesia (10/19/2016), Heart murmur, History of cervical spinal surgery (10/19/2016), History of lumbosacral spine surgery (10/19/2016), Hyperlipidemia, Indigestion, Non-ST elevation (NSTEMI) myocardial infarction ([place]) (03/15/2019), Normal echocardiogram (05/31/05), Normal nuclear stress test (07/26/02), Polyneuropathy (10/19/2016), Right bundle branch block, Trace mitral regurgitation by prior echocardiogram, Trace tricuspid regurgitation by prior echocardiogram, Unstable angina ([place]) (03/14/2019), and Upper airway cough syndrome (09/12/2014). here with    Polyneuropathy  Dysesthesia  Mark Garner is doing well on lamotrigine 63m BID. We will continue current plan. He is aware we can increase dose in the future if nighttime symptoms worsen. He will remain active. Continue close follow up with PCP and cardiology. Fall precautions advised. Healthy lifestyle habits encouraged. He will follow up in 1 year, sooner if needed.   No orders of the defined types were placed in this encounter.    Meds ordered this encounter  Medications   lamoTRIgine (LAMICTAL) 100 MG tablet    Sig: Take 0.5 tablets (50 mg total) by mouth 2 (two) times daily.    Dispense:  90 tablet    Refill:  4    Order Specific Question:   Supervising Provider    Answer:   AMelvenia Beam[[[id number]     ADebbora Presto MSN, FNP-C 11/05/2021, 11:34 AM  [facility] [address] [place] [phone]

## 2021-11-05 ENCOUNTER — Ambulatory Visit: Payer: Medicare Other | Admitting: Family Medicine

## 2021-11-05 ENCOUNTER — Encounter: Payer: Self-pay | Admitting: Family Medicine

## 2021-11-05 VITALS — BP 132/66 | HR 57 | Ht 69.0 in | Wt 166.0 lb

## 2021-11-05 DIAGNOSIS — R208 Other disturbances of skin sensation: Secondary | ICD-10-CM | POA: Diagnosis not present

## 2021-11-05 DIAGNOSIS — I779 Disorder of arteries and arterioles, unspecified: Secondary | ICD-10-CM

## 2021-11-05 DIAGNOSIS — G629 Polyneuropathy, unspecified: Secondary | ICD-10-CM | POA: Diagnosis not present

## 2021-11-05 MED ORDER — LAMOTRIGINE 100 MG PO TABS: 50.0000 mg | ORAL_TABLET | Freq: Two times a day (BID) | ORAL | 4 refills | Status: DC

## 2021-11-26 ENCOUNTER — Telehealth: Payer: Self-pay | Admitting: Cardiovascular Disease

## 2021-11-26 NOTE — Telephone Encounter (Signed)
Woke up sob around 5:30 am.  Uncomfortable.  HR was about 105-110.  Denies chest pressure/pain.  In the past has had similar kinds of episode (twice) and both times he called and was told to take an extra 1/2 tab Lopressor.   HR has been up and down all day 60s-105. Bumping up with little exertion like going up stairs.    Denies fluttering in chest.  Wearing a fit bit type of band that is saying no abnormal rhythm and pt feels pulse rate recording is accurate on this.       Has not checked BP recently.  Runs around 130s/70.  Doing more activities than normal.  Little more stress than normal due to helping wife who had shoulder sx.  Stents in 2020.  Symptoms not similar to how they were at that time.  Was planning to take a bike ride today until woke feeling like he did.  Not feeling terrible now, but unusual for him that HR is jumping around.

## 2021-11-26 NOTE — Telephone Encounter (Signed)
Called pt back and let him know Dr. Acie Fredrickson is not back in office until Monday and may not see message before that time.  I asked him about staying hydrated.  He thinks he is but really isn't sure.  He will increase fluids a bit to see if that makes a difference.  I adv that as in the past, if his HR bumps up to low 100s and he gets that "uncomfortable" feeling, it is ok to take an extra 1/2 tab of metoprolol.   Adv if any additional symptoms at all or if HR gets higher and sustains that he needs to call us back ASAP.  Pt voices understanding and agreement.

## 2021-11-26 NOTE — Telephone Encounter (Signed)
° °  STAT if HR is under 50 or over 120 (normal HR is 60-100 beats per minute)  What is your heart rate? Upper 90s  Do you have a log of your heart rate readings (document readings)? 90-100  Do you have any other symptoms? Pt said, his HR been elevated as soon as he gets up, when he go up the stairs then his HR go up to upper 90s, he said he felt like he is dragging a center block behind him.

## 2021-11-30 ENCOUNTER — Other Ambulatory Visit: Payer: Self-pay | Admitting: Cardiovascular Disease

## 2021-11-30 NOTE — Telephone Encounter (Signed)
Outreach  made to Pt.  He feels "back to normal today".  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  No complaints.

## 2021-12-22 DIAGNOSIS — L814 Other melanin hyperpigmentation: Secondary | ICD-10-CM | POA: Diagnosis not present

## 2021-12-22 DIAGNOSIS — L821 Other seborrheic keratosis: Secondary | ICD-10-CM | POA: Diagnosis not present

## 2021-12-22 DIAGNOSIS — D225 Melanocytic nevi of trunk: Secondary | ICD-10-CM | POA: Diagnosis not present

## 2021-12-22 DIAGNOSIS — L570 Actinic keratosis: Secondary | ICD-10-CM | POA: Diagnosis not present

## 2021-12-31 ENCOUNTER — Other Ambulatory Visit: Payer: Self-pay | Admitting: Cardiovascular Disease

## 2022-01-07 DIAGNOSIS — J22 Unspecified acute lower respiratory infection: Secondary | ICD-10-CM | POA: Diagnosis not present

## 2022-01-07 DIAGNOSIS — Z03818 Encounter for observation for suspected exposure to other biological agents ruled out: Secondary | ICD-10-CM | POA: Diagnosis not present

## 2022-01-07 DIAGNOSIS — R059 Cough, unspecified: Secondary | ICD-10-CM | POA: Diagnosis not present

## 2022-02-01 DIAGNOSIS — J069 Acute upper respiratory infection, unspecified: Secondary | ICD-10-CM | POA: Diagnosis not present

## 2022-02-01 DIAGNOSIS — R059 Cough, unspecified: Secondary | ICD-10-CM | POA: Diagnosis not present

## 2022-03-09 ENCOUNTER — Ambulatory Visit
Admission: RE | Admit: 2022-03-09 | Discharge: 2022-03-09 | Disposition: A | Discharge status: Home / Self Care | Payer: Medicare Other | Source: Ambulatory Visit | Attending: Family Medicine | Admitting: Family Medicine

## 2022-03-09 ENCOUNTER — Other Ambulatory Visit: Payer: Self-pay | Admitting: Family Medicine

## 2022-03-09 DIAGNOSIS — R059 Cough, unspecified: Secondary | ICD-10-CM | POA: Diagnosis not present

## 2022-04-21 ENCOUNTER — Encounter: Payer: Self-pay | Admitting: Cardiovascular Disease
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
## 2022-04-23 ENCOUNTER — Encounter: Payer: Self-pay | Admitting: Cardiovascular Disease

## 2022-04-23 ENCOUNTER — Ambulatory Visit: Payer: Medicare Other | Admitting: Cardiovascular Disease

## 2022-04-23 VITALS — BP 138/74 | HR 57 | Ht 69.0 in | Wt 166.2 lb

## 2022-04-23 DIAGNOSIS — I2510 Atherosclerotic heart disease of native coronary artery without angina pectoris: Secondary | ICD-10-CM

## 2022-04-23 DIAGNOSIS — R0600 Dyspnea, unspecified: Secondary | ICD-10-CM

## 2022-04-23 DIAGNOSIS — I200 Unstable angina: Secondary | ICD-10-CM | POA: Diagnosis not present

## 2022-04-23 DIAGNOSIS — R011 Cardiac murmur, unspecified: Secondary | ICD-10-CM

## 2022-04-23 NOTE — Patient Instructions (Signed)
Medication Instructions:  *If you need a refill on your cardiac medications before your next appointment, please call your pharmacy*  Lab Work: If you have labs (blood work) drawn today and your tests are completely normal, you will receive your results only by: [place] (if you have MyChart) OR A paper copy in the mail If you have any lab test that is abnormal or we need to change your treatment, we will call you to review the results.   Testing/Procedures: Your physician has requested that you have en exercise stress myoview. For further information please visit [email or web address]. Please follow instruction sheet, as given.  Your physician has requested that you have an echocardiogram. Echocardiography is a painless test that uses sound waves to create images of your heart. It provides your doctor with information about the size and shape of your heart and how well your heart's chambers and valves are working. This procedure takes approximately one hour. There are no restrictions for this procedure.  Follow-Up: At [facility], you and your health needs are our priority.  As part of our continuing mission to provide you with exceptional heart care, we have created designated Provider Care Teams.  These Care Teams include your primary Cardiologist (physician) and Advanced Practice Providers (APPs -  Physician Assistants and Nurse Practitioners) who all work together to provide you with the care you need, when you need it.  We recommend signing up for the patient portal called "MyChart".  Sign up information is provided on this After Visit Summary.  MyChart is used to connect with patients for Virtual Visits (Telemedicine).  Patients are able to view lab/test results, encounter notes, upcoming appointments, etc.  Non-urgent messages can be sent to your provider as well.   To learn more about what you can do with MyChart, go to [email or web address].    Your next appointment:   6  month(s)  The format for your next appointment:   In Person  Provider:   Mertie Moores, MD {   Important Information About Sugar

## 2022-05-04 ENCOUNTER — Telehealth (HOSPITAL_COMMUNITY): Payer: Self-pay | Admitting: Radiology

## 2022-05-04 NOTE — Telephone Encounter (Signed)
Patient given detailed instructions per Myocardial Perfusion Study Information Sheet for the test on 8/8 at 8:  . Patient notified to arrive 15 minutes early and that it is imperative to arrive on time for appointment to keep from having the test rescheduled.  If you need to cancel or reschedule your appointment, please call the office within 24 hours of your appointment. . Patient verbalized understanding.EHK

## 2022-05-11 ENCOUNTER — Ambulatory Visit (HOSPITAL_BASED_OUTPATIENT_CLINIC_OR_DEPARTMENT_OTHER): Payer: Medicare Other

## 2022-05-11 ENCOUNTER — Ambulatory Visit (HOSPITAL_COMMUNITY): Payer: Medicare Other | Attending: Cardiology

## 2022-05-11 DIAGNOSIS — R011 Cardiac murmur, unspecified: Secondary | ICD-10-CM | POA: Diagnosis not present

## 2022-05-11 DIAGNOSIS — I2510 Atherosclerotic heart disease of native coronary artery without angina pectoris: Secondary | ICD-10-CM | POA: Insufficient documentation

## 2022-05-11 DIAGNOSIS — R0600 Dyspnea, unspecified: Secondary | ICD-10-CM | POA: Diagnosis not present

## 2022-05-11 LAB — ECHOCARDIOGRAM COMPLETE
Area-P 1/2: 2.5 cm2
Height: 69 in
S' Lateral: 2.4 cm
Weight: 2656 oz

## 2022-05-11 LAB — MYOCARDIAL PERFUSION IMAGING
Angina Index: 0
Duke Treadmill Score: 4
Estimated workload: 5.8
Exercise duration (min): 4 min
LV dias vol: 49 mL (ref 62–150)
LV sys vol: 17 mL
MPHR: 143 {beats}/min
Nuc Stress EF: 65 %
Peak HR: 146 {beats}/min
Percent HR: 102 %
RPE: 19
Rest HR: 60 {beats}/min
Rest Nuclear Isotope Dose: 10.7 mCi
SDS: 0
SRS: 0
SSS: 0
ST Depression (mm): 0 mm
Stress Nuclear Isotope Dose: 32.2 mCi
TID: 0.72

## 2022-05-11 MED ORDER — TECHNETIUM TC 99M TETROFOSMIN IV KIT
32.2000 | PACK | Freq: Once | INTRAVENOUS | Status: AC | PRN
  Administered 2022-05-11: 32.2 via INTRAVENOUS

## 2022-05-11 MED ORDER — REGADENOSON 0.4 MG/5ML IV SOLN: 0.4000 mg | Freq: Once | INTRAVENOUS | Status: DC

## 2022-05-11 MED ORDER — TECHNETIUM TC 99M TETROFOSMIN IV KIT
10.7000 | PACK | Freq: Once | INTRAVENOUS | Status: AC | PRN
  Administered 2022-05-11: 10.7 via INTRAVENOUS

## 2022-05-16 ENCOUNTER — Other Ambulatory Visit: Payer: Self-pay | Admitting: Cardiovascular Disease

## 2022-05-17 NOTE — Telephone Encounter (Signed)
Prescription refill request for Eliquis received. Indication:Afib Last office visit:7/23 Scr:0.9 Age: 76 Weight:75.3 kg  Prescription refilled

## 2022-05-18 ENCOUNTER — Other Ambulatory Visit: Payer: Self-pay | Admitting: Internal Medicine

## 2022-05-18 DIAGNOSIS — I214 Non-ST elevation (NSTEMI) myocardial infarction: Secondary | ICD-10-CM

## 2022-05-18 DIAGNOSIS — E785 Hyperlipidemia, unspecified: Secondary | ICD-10-CM

## 2022-05-24 DIAGNOSIS — M216X2 Other acquired deformities of left foot: Secondary | ICD-10-CM | POA: Diagnosis not present

## 2022-05-24 DIAGNOSIS — M216X1 Other acquired deformities of right foot: Secondary | ICD-10-CM | POA: Diagnosis not present

## 2022-05-24 DIAGNOSIS — M7742 Metatarsalgia, left foot: Secondary | ICD-10-CM | POA: Diagnosis not present

## 2022-05-24 DIAGNOSIS — M7741 Metatarsalgia, right foot: Secondary | ICD-10-CM | POA: Diagnosis not present

## 2022-06-17 ENCOUNTER — Ambulatory Visit (HOSPITAL_COMMUNITY)
Admission: RE | Admit: 2022-06-17 | Discharge: 2022-06-17 | Disposition: A | Discharge status: Home / Self Care | Payer: Medicare Other | Source: Ambulatory Visit | Attending: Cardiology | Admitting: Cardiology

## 2022-06-17 DIAGNOSIS — I779 Disorder of arteries and arterioles, unspecified: Secondary | ICD-10-CM | POA: Insufficient documentation

## 2022-06-17 DIAGNOSIS — I6523 Occlusion and stenosis of bilateral carotid arteries: Secondary | ICD-10-CM | POA: Insufficient documentation

## 2022-07-02 ENCOUNTER — Other Ambulatory Visit: Payer: Self-pay | Admitting: Cardiovascular Disease

## 2022-07-09 DIAGNOSIS — M25552 Pain in left hip: Secondary | ICD-10-CM | POA: Diagnosis not present

## 2022-07-09 DIAGNOSIS — M7062 Trochanteric bursitis, left hip: Secondary | ICD-10-CM | POA: Diagnosis not present

## 2022-07-21 ENCOUNTER — Other Ambulatory Visit (HOSPITAL_COMMUNITY): Payer: Self-pay | Admitting: Cardiovascular Disease

## 2022-07-21 DIAGNOSIS — I779 Disorder of arteries and arterioles, unspecified: Secondary | ICD-10-CM

## 2022-09-07 ENCOUNTER — Other Ambulatory Visit: Payer: Self-pay | Admitting: Cardiovascular Disease

## 2022-09-21 ENCOUNTER — Telehealth: Payer: Self-pay | Admitting: Internal Medicine

## 2022-09-21 NOTE — Telephone Encounter (Signed)
Request Reference Number: [id number]. REPATHA SURE INJ '140MG'$ /ML is approved through 10/04/2023. Your patient may now fill this prescription and it will be covered.

## 2022-09-21 NOTE — Telephone Encounter (Signed)
PA for repatha submitted via CMM (Key: [id number])

## 2022-09-22 DIAGNOSIS — H6122 Impacted cerumen, left ear: Secondary | ICD-10-CM | POA: Diagnosis not present

## 2022-09-24 DIAGNOSIS — M7062 Trochanteric bursitis, left hip: Secondary | ICD-10-CM | POA: Diagnosis not present

## 2022-09-28 ENCOUNTER — Telehealth: Payer: Self-pay | Admitting: Cardiovascular Disease

## 2022-09-28 NOTE — Telephone Encounter (Signed)
Returned call to patient who states he currently has the flu. He has noticed that his HR over the last 2 days "is jumping all over the place." States that his HR lowest and highest per day has been 31-125 with average's staying in mid 70's. He has history of PAF, takes Eliquis and Metoprolol. He denies all symptoms of CP, SOB, dizziness, lethargy. Advised he's experiencing A-fib again, brought on by flu, and that he's on appropriate medication regimed. Understands to stay hydrated, treat his symptoms and call us if needed.

## 2022-09-28 NOTE — Telephone Encounter (Signed)
STAT if HR is under 50 or over 120 (normal HR is 60-100 beats per minute)  What is your heart rate?  HR 82 - today   Do you have a log of your heart rate readings (document readings)?  31-71 average 70 52-119 average  73 64-125 average 81  Do you have any other symptoms?  Pt said, his HR is low and been feeling weak and has no energy do to anything. He wants to know what he needs to do

## 2022-09-30 DIAGNOSIS — U071 COVID-19: Secondary | ICD-10-CM | POA: Diagnosis not present

## 2022-09-30 DIAGNOSIS — R059 Cough, unspecified: Secondary | ICD-10-CM | POA: Diagnosis not present

## 2022-09-30 DIAGNOSIS — R519 Headache, unspecified: Secondary | ICD-10-CM | POA: Diagnosis not present

## 2022-10-05 ENCOUNTER — Other Ambulatory Visit: Payer: Self-pay | Admitting: *Deleted

## 2022-10-05 MED ORDER — ISOSORBIDE MONONITRATE ER 30 MG PO TB24: ORAL_TABLET | ORAL | 1 refills | Status: DC

## 2022-10-12 DIAGNOSIS — Z79899 Other long term (current) drug therapy: Secondary | ICD-10-CM | POA: Diagnosis not present

## 2022-10-12 DIAGNOSIS — D649 Anemia, unspecified: Secondary | ICD-10-CM | POA: Diagnosis not present

## 2022-10-12 DIAGNOSIS — I480 Paroxysmal atrial fibrillation: Secondary | ICD-10-CM | POA: Diagnosis not present

## 2022-10-12 DIAGNOSIS — R7303 Prediabetes: Secondary | ICD-10-CM | POA: Diagnosis not present

## 2022-10-12 DIAGNOSIS — D6869 Other thrombophilia: Secondary | ICD-10-CM | POA: Diagnosis not present

## 2022-10-12 DIAGNOSIS — E7800 Pure hypercholesterolemia, unspecified: Secondary | ICD-10-CM | POA: Diagnosis not present

## 2022-10-12 DIAGNOSIS — Z0001 Encounter for general adult medical examination with abnormal findings: Secondary | ICD-10-CM | POA: Diagnosis not present

## 2022-10-27 ENCOUNTER — Other Ambulatory Visit: Payer: Self-pay | Admitting: Cardiovascular Disease

## 2022-10-28 ENCOUNTER — Ambulatory Visit: Payer: Medicare Other | Attending: Cardiovascular Disease | Admitting: Cardiovascular Disease

## 2022-10-28 ENCOUNTER — Encounter: Payer: Self-pay | Admitting: Cardiovascular Disease

## 2022-10-28 VITALS — BP 112/66 | HR 62 | Ht 69.0 in | Wt 163.2 lb

## 2022-10-28 DIAGNOSIS — I2510 Atherosclerotic heart disease of native coronary artery without angina pectoris: Secondary | ICD-10-CM

## 2022-10-28 DIAGNOSIS — I480 Paroxysmal atrial fibrillation: Secondary | ICD-10-CM

## 2022-10-28 NOTE — Patient Instructions (Signed)
Medication Instructions:  Your physician recommends that you continue on your current medications as directed. Please refer to the Current Medication list given to you today.  *If you need a refill on your cardiac medications before your next appointment, please call your pharmacy*  Lab Work: None ordered  Testing/Procedures: None ordered  Follow-Up: At [facility], you and your health needs are our priority.  As part of our continuing mission to provide you with exceptional heart care, we have created designated Provider Care Teams.  These Care Teams include your primary Cardiologist (physician) and Advanced Practice Providers (APPs -  Physician Assistants and Nurse Practitioners) who all work together to provide you with the care you need, when you need it.  Your next appointment:   1 year(s)  The format for your next appointment:   In Person  Provider:   Mertie Moores, MD {
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
## 2022-11-04 NOTE — Progress Notes (Deleted)
No chief complaint on file.    HISTORY OF PRESENT ILLNESS:  11/04/22 ALL:  Mark Garner returns for follow up for polyneuropathy. He continues lamotrigine '50mg'$  BID.   11/05/2021 ALL: Mark Garner is a 77 y.o. male here today for follow up for polyneuropathy. He continues lamotrigine '50mg'$  BID (taking 1/2 of '100mg'$  tab BID). He feels symptoms are stable. He may have worsening pain of toes at night but doesn't feel this is consistent or bothersome at this time. He does note his feel are more sensitive when walking on hot sand. He always wears shoes.   He is followed closely by PCP and cardiology. BP is well managed. On Eliquis and Plavix s/p stent placement of LAD and LCx with afib . Cholesterol improving on Repatha.  He is an active cyclist. He is doing very well. 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    HISTORY (copied from Dr Garth Bigness previous note)  Mark Garner is a 77 y.o. man with polyneuropathy and numbness/tingling in the feet and lower legs.   Update 11/05/2020: He feels his neuropathic dysesthesia is a little worse with a pins/needles sensation.   He notes symptoms more while laying down and less when active.    He is on lamotrigine 50 mg po bid and tolerates that well.  No rashes.    He had a small MI in June 2020 and is trying to exercise more.   He saw cardiology recently and was told he is stable.   As part of the cardiac evaluation, he had a carotid ultrasound.  Stenosis on the right is about 50% in the distal CCA but less than 39% in the internal carotid artery.  On the left, stenosis was difficult to calculate due to plaque but was felt to be between 1 to 39% based on velocities.   He is on Eliquis and Plavix for the time being  (had stents and placed on stentds) but one drug may be stopped.   He had one incident of AFIb without rapid heart rate.      He is exercising more.  He has vaccination for COVID-19 and booster.      He has a reduced  nasolabial fold on the right noted 6 months ago.  No forehead weakness.  He rarely drools on the right.      Has had a couple episodes of choking while swallowing.       He notes mild bilateral hearing loss.  Of note his father, uncle and both brothers have hearing issues.    He also notes reduced taste and smell.     Data:   Nerve conduction study performed by Dr. Brien Few 08/12/2016.   The nerve conduction study showed absent sural sensory responses, tibial motor slowing in the arms were normal. EMG showed chronic neuropathic changes in the intrinsic foot muscles.  I also reviewed the MRI of the lumbar spine dated 09/10/2009.   There are degenerative changes at L3-L4, L4-L5 and L5-S1. At L4-L5, there is right foraminal narrowing but there does not appear to be nerve root compression. At L5-S1, there is moderate left foraminal narrowing encroaching upon the left L5 nerve root without causing definite compression.    Labs1/2018:  SPEP/IFE, ANA, ESR, B12 were normal or negative.   MRI also showed around narrowing, worse on the left at L5-S1 but no definite nerve root compression.     REVIEW OF SYSTEMS: Out of a complete 14 system  review of symptoms, the patient complains only of the following symptoms, neuropathy of feet, and all other reviewed systems are negative.   ALLERGIES: Allergies  Allergen Reactions   Other     Other reaction(s): Other (See Comments) Myalgias with multiple statins   Statins Other (See Comments)    Myalgias with multiple statins   Amoxicillin     Other reaction(s): GI   Fenofibrate Other (See Comments)    Leg pain   Gemfibrozil Other (See Comments)    Leg pain   Niacin And Related     Severe flushing     HOME MEDICATIONS: Outpatient Medications Prior to Visit  Medication Sig Dispense Refill   acetaminophen (TYLENOL) 500 MG tablet Take 500 mg by mouth every 6 (six) hours as needed (pain).     clopidogrel (PLAVIX) 75 MG tablet TAKE 1 TABLET(75 MG) BY MOUTH DAILY  90 tablet 3   clotrimazole-betamethasone (LOTRISONE) cream 1 application to affected area     ELIQUIS 5 MG TABS tablet TAKE 1 TABLET(5 MG) BY MOUTH TWICE DAILY 180 tablet 1   Evolocumab (REPATHA SURECLICK) [id number] MG/ML SOAJ INJECT '140MG'$  INTO THE SKIN EVERY 14 DAYS 6 mL 3   famotidine (PEPCID) 20 MG tablet Take 20 mg by mouth daily.     isosorbide mononitrate (IMDUR) 30 MG 24 hr tablet TAKE 1 TABLET(30 MG) BY MOUTH DAILY 90 tablet 1   lamoTRIgine (LAMICTAL) 100 MG tablet Take 0.5 tablets (50 mg total) by mouth 2 (two) times daily. 90 tablet 4   losartan (COZAAR) 50 MG tablet TAKE 1 TABLET(50 MG) BY MOUTH DAILY 90 tablet 2   metoprolol tartrate (LOPRESSOR) 25 MG tablet TAKE 1/2 TABLET(12.5 MG) BY MOUTH TWICE DAILY 90 tablet 3   nitroGLYCERIN (NITROSTAT) 0.4 MG SL tablet DISSOLVE 1 TABLET UNDER THE TONGUE EVERY 5 MINUTES AS NEEDED FOR CHEST PAIN 25 tablet 3   Simethicone (GAS-X EXTRA STRENGTH PO) Take 1 capsule by mouth 4 (four) times daily as needed (gas).     No facility-administered medications prior to visit.     PAST MEDICAL HISTORY: Past Medical History:  Diagnosis Date   Chest pain    Chest tightness 04/06/2011   Dysesthesia 10/19/2016   Heart murmur    History of cervical spinal surgery 10/19/2016   History of lumbosacral spine surgery 10/19/2016   Hyperlipidemia    Indigestion    Non-ST elevation (NSTEMI) myocardial infarction ([place]) 03/15/2019   Normal echocardiogram 05/31/05   normal LV function; trace MR;trace tricuspid regurgitation   Normal nuclear stress test 07/26/02   no evidence of ischemia; nml LVF   Polyneuropathy 10/19/2016   Right bundle branch block    Trace mitral regurgitation by prior echocardiogram    Trace tricuspid regurgitation by prior echocardiogram    Unstable angina ([place]) 03/14/2019   Upper airway cough syndrome 09/12/2014   Followed in Pulmonary clinic/ [facility]/ [facility]  - gerd rx 09/12/2014 >>>      PAST SURGICAL HISTORY: Past Surgical History:   Procedure Laterality Date   CORONARY STENT INTERVENTION N/A 03/14/2019   Procedure: CORONARY STENT INTERVENTION;  Surgeon: [name], MD;  Location: [place] [facility];  Service: Cardiovascular;  Laterality: N/A;   LEFT HEART CATH AND CORONARY ANGIOGRAPHY N/A 03/14/2019   Procedure: LEFT HEART CATH AND CORONARY ANGIOGRAPHY;  Surgeon: [name], MD;  Location: [place] [facility];  Service: Cardiovascular;  Laterality: N/A;   SPINE SURGERY     x 2      FAMILY  HISTORY: Family History  Problem Relation Age of Onset   Heart disease Brother 42       MI/ptca     SOCIAL HISTORY: Social History   Socioeconomic History   Marital status: Married    Spouse name: Not on file   Number of children: Not on file   Years of education: Not on file   Highest education level: Not on file  Occupational History   Occupation: Retired  Tobacco Use   Smoking status: Former    Packs/day: 1.00    Years: 5.00    Total pack years: 5.00    Types: Cigarettes    Quit date: 10/04/1984    Years since quitting: 38.1   Smokeless tobacco: Never  Substance and Sexual Activity   Alcohol use: No    Alcohol/week: 0.0 standard drinks of alcohol   Drug use: No   Sexual activity: Not on file  Other Topics Concern   Not on file  Social History Narrative   Not on file   Social Determinants of Health   Financial Resource Strain: Not on file  Food Insecurity: Not on file  Transportation Needs: Not on file  Physical Activity: Not on file  Stress: Not on file  Social Connections: Not on file  Intimate Partner Violence: Not on file     PHYSICAL EXAM  There were no vitals filed for this visit.  There is no height or weight on file to calculate BMI.  Generalized: Well developed, in no acute distress  Cardiology: normal rate and rhythm, no murmur auscultated  Respiratory: clear to auscultation bilaterally    Neurological examination  Mentation: Alert oriented to time, place, history  taking. Follows all commands speech and language fluent Cranial nerve II-XII: Pupils were equal round reactive to light. Extraocular movements were full, visual field were full on confrontational test. Facial sensation and strength were normal. Head turning and shoulder shrug  were normal and symmetric. Motor: The motor testing reveals 5 over 5 strength of all 4 extremities. Good symmetric motor tone is noted throughout.  Sensory: Sensory testing is intact to soft touch on all 4 extremities. No evidence of extinction is noted.  Gait and station: Gait is normal.    DIAGNOSTIC DATA (LABS, IMAGING, TESTING) - I reviewed patient records, labs, notes, testing and imaging myself where available.  Lab Results  Component Value Date   WBC 7.8 04/14/2020   HGB 13.5 04/14/2020   HCT 38.2 04/14/2020   MCV 90 04/14/2020   PLT 302 04/14/2020      Component Value Date/Time   NA 139 04/14/2020 1059   K 4.9 04/14/2020 1059   CL 102 04/14/2020 1059   CO2 25 04/14/2020 1059   GLUCOSE 90 04/14/2020 1059   GLUCOSE 85 03/15/2019 0218   BUN 15 04/14/2020 1059   CREATININE 0.87 04/14/2020 1059   CALCIUM 9.6 04/14/2020 1059   PROT 6.3 01/08/2020 0932   ALBUMIN 4.3 01/08/2020 0932   AST 17 01/08/2020 0932   ALT 12 01/08/2020 0932   ALKPHOS 49 01/08/2020 0932   BILITOT 0.4 01/08/2020 0932   GFRNONAA 84 04/14/2020 1059   GFRAA 98 04/14/2020 1059   Lab Results  Component Value Date   CHOL 95 (L) 01/08/2020   HDL 36 (L) 01/08/2020   LDLCALC 31 01/08/2020   LDLDIRECT 80.7 06/14/2014   TRIG 166 (H) 01/08/2020   CHOLHDL 2.6 01/08/2020   No results found for: "HGBA1C" Lab Results  Component Value Date   VITAMINB12  1,016 10/19/2016   Lab Results  Component Value Date   TSH 4.319 03/14/2019        No data to display               No data to display           ASSESSMENT AND PLAN  77 y.o. year old male  has a past medical history of Chest pain, Chest tightness (04/06/2011),  Dysesthesia (10/19/2016), Heart murmur, History of cervical spinal surgery (10/19/2016), History of lumbosacral spine surgery (10/19/2016), Hyperlipidemia, Indigestion, Non-ST elevation (NSTEMI) myocardial infarction ([place]) (03/15/2019), Normal echocardiogram (05/31/05), Normal nuclear stress test (07/26/02), Polyneuropathy (10/19/2016), Right bundle branch block, Trace mitral regurgitation by prior echocardiogram, Trace tricuspid regurgitation by prior echocardiogram, Unstable angina ([place]) (03/14/2019), and Upper airway cough syndrome (09/12/2014). here with    No diagnosis found.  Mark Garner is doing well on lamotrigine '50mg'$  BID. We will continue current plan. He is aware we can increase dose in the future if nighttime symptoms worsen. He will remain active. Continue close follow up with PCP and cardiology. Fall precautions advised. Healthy lifestyle habits encouraged. He will follow up in 1 year, sooner if needed.   No orders of the defined types were placed in this encounter.    No orders of the defined types were placed in this encounter.     Debbora Presto, MSN, FNP-C 11/04/2022, 8:29 AM  [facility] [address] [place], [place] [address] [phone]

## 2022-11-04 NOTE — Patient Instructions (Incomplete)

## 2022-11-08 ENCOUNTER — Ambulatory Visit: Payer: Medicare Other | Admitting: Family Medicine

## 2022-11-08 ENCOUNTER — Encounter: Payer: Self-pay | Admitting: Neurology

## 2022-11-08 DIAGNOSIS — G629 Polyneuropathy, unspecified: Secondary | ICD-10-CM

## 2022-11-16 ENCOUNTER — Other Ambulatory Visit: Payer: Self-pay | Admitting: Cardiovascular Disease

## 2022-11-16 DIAGNOSIS — I480 Paroxysmal atrial fibrillation: Secondary | ICD-10-CM

## 2022-11-16 NOTE — Telephone Encounter (Signed)
Prescription refill request for Eliquis received. Indication:Afib  Last office visit: 10/28/22 ([place])  Scr: 0.84 ( 10/12/22 via [place])  Age: 77 Weight: 74kg  Appropriate dose.Refill sent.

## 2022-11-18 ENCOUNTER — Other Ambulatory Visit: Payer: Self-pay

## 2022-11-18 MED ORDER — METOPROLOL TARTRATE 25 MG PO TABS: ORAL_TABLET | ORAL | 3 refills | Status: DC

## 2022-12-14 ENCOUNTER — Telehealth: Payer: Self-pay | Admitting: Cardiovascular Disease

## 2022-12-14 ENCOUNTER — Ambulatory Visit: Payer: Medicare Other | Attending: Cardiovascular Disease | Admitting: Cardiovascular Disease

## 2022-12-14 ENCOUNTER — Encounter: Payer: Self-pay | Admitting: Cardiovascular Disease

## 2022-12-14 VITALS — BP 118/72 | HR 53 | Ht 69.0 in | Wt 160.2 lb

## 2022-12-14 DIAGNOSIS — I2510 Atherosclerotic heart disease of native coronary artery without angina pectoris: Secondary | ICD-10-CM

## 2022-12-14 DIAGNOSIS — E782 Mixed hyperlipidemia: Secondary | ICD-10-CM | POA: Diagnosis not present

## 2022-12-14 DIAGNOSIS — R079 Chest pain, unspecified: Secondary | ICD-10-CM

## 2022-12-14 LAB — TROPONIN T: Troponin T (Highly Sensitive): 13 ng/L (ref 0–22)
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
## 2022-12-14 NOTE — Patient Instructions (Signed)
Medication Instructions:  Your physician recommends that you continue on your current medications as directed. Please refer to the Current Medication list given to you today.  *If you need a refill on your cardiac medications before your next appointment, please call your pharmacy*   Lab Work: LFT, Amylase, Troponin T level, CBC, BMET today If you have labs (blood work) drawn today and your tests are completely normal, you will receive your results only by: [place] (if you have MyChart) OR A paper copy in the mail If you have any lab test that is abnormal or we need to change your treatment, we will call you to review the results.  Testing/Procedures: NONE   Follow-Up: At [facility], you and your health needs are our priority.  As part of our continuing mission to provide you with exceptional heart care, we have created designated Provider Care Teams.  These Care Teams include your primary Cardiologist (physician) and Advanced Practice Providers (APPs -  Physician Assistants and Nurse Practitioners) who all work together to provide you with the care you need, when you need it.  We recommend signing up for the patient portal called "MyChart".  Sign up information is provided on this After Visit Summary.  MyChart is used to connect with patients for Virtual Visits (Telemedicine).  Patients are able to view lab/test results, encounter notes, upcoming appointments, etc.  Non-urgent messages can be sent to your provider as well.   To learn more about what you can do with MyChart, go to [email or web address].    Your next appointment:   6 month(s)  Provider:   Mertie Moores, MD

## 2022-12-14 NOTE — Telephone Encounter (Signed)
Pt c/o of Chest Pain: STAT if CP now or developed within 24 hours  1. Are you having CP right now? No - but doesn't feel like himself. Nitroglycerin calmed down the pain.   2. Are you experiencing any other symptoms (ex. SOB, nausea, vomiting, sweating)? No   3. How long have you been experiencing CP? Since 8:30 a.m. this morning.   4. Is your CP continuous or coming and going? Continuous   5. Have you taken Nitroglycerin? Yes - took 3 nitroglycerin  ?

## 2022-12-14 NOTE — Telephone Encounter (Signed)
Spoke with patient and he stated this morning he started to have chest pain while making his coffee. He took 3 nitroglycerin and that helped relieve the pain. He denies any shortness of breath, dizziness, nausea, vomiting or sweating. His blood pressure is 150/80 with heart rate at 65. He is aware that he reached the max dose of nitroglycerin in a 24 hr period. If pain occurs to immediately call 911. He has appointment scheduled with Dr. Acie Fredrickson today at 2pm. He verbalized understanding.

## 2022-12-15 LAB — CBC
Hematocrit: 39.1 % (ref 37.5–51.0)
Hemoglobin: 13.2 g/dL (ref 13.0–17.7)
MCH: 30.7 pg (ref 26.6–33.0)
MCHC: 33.8 g/dL (ref 31.5–35.7)
MCV: 91 fL (ref 79–97)
Platelets: 362 10*3/uL (ref 150–450)
RBC: 4.3 x10E6/uL (ref 4.14–5.80)
RDW: 12.9 % (ref 11.6–15.4)
WBC: 7.9 10*3/uL (ref 3.4–10.8)

## 2022-12-15 LAB — HEPATIC FUNCTION PANEL
ALT: 14 IU/L (ref 0–44)
AST: 21 IU/L (ref 0–40)
Albumin: 4.5 g/dL (ref 3.8–4.8)
Alkaline Phosphatase: 49 IU/L (ref 44–121)
Bilirubin Total: 0.3 mg/dL (ref 0.0–1.2)
Bilirubin, Direct: <0.1 mg/dL (ref 0.00–0.40)
Total Protein: 6.6 g/dL (ref 6.0–8.5)

## 2022-12-15 LAB — BASIC METABOLIC PANEL
BUN/Creatinine Ratio: 17 (ref 10–24)
BUN: 14 mg/dL (ref 8–27)
CO2: 25 mmol/L (ref 20–29)
Calcium: 9.8 mg/dL (ref 8.6–10.2)
Chloride: 101 mmol/L (ref 96–106)
Creatinine, Ser: 0.81 mg/dL (ref 0.76–1.27)
Glucose: 81 mg/dL (ref 70–99)
Potassium: 5.2 mmol/L (ref 3.5–5.2)
Sodium: 140 mmol/L (ref 134–144)
eGFR: 90 mL/min/{1.73_m2} (ref >59)

## 2022-12-15 LAB — AMYLASE: Amylase: 73 U/L (ref 31–110)

## 2022-12-21 ENCOUNTER — Telehealth: Payer: Self-pay

## 2022-12-21 NOTE — Telephone Encounter (Signed)
Normal labs reviewed with patient who verbalizes understanding.

## 2022-12-21 NOTE — Telephone Encounter (Signed)
-----   Message from Thayer Headings, MD sent at 12/19/2022  7:07 AM EDT ----- LFTS are stable Amylase is normal  CBC WNL BMP is  WNL

## 2023-01-12 DIAGNOSIS — E119 Type 2 diabetes mellitus without complications: Secondary | ICD-10-CM | POA: Diagnosis not present

## 2023-01-12 DIAGNOSIS — I5032 Chronic diastolic (congestive) heart failure: Secondary | ICD-10-CM | POA: Diagnosis not present

## 2023-01-12 DIAGNOSIS — I25119 Atherosclerotic heart disease of native coronary artery with unspecified angina pectoris: Secondary | ICD-10-CM | POA: Diagnosis not present

## 2023-01-25 ENCOUNTER — Other Ambulatory Visit: Payer: Self-pay | Admitting: *Deleted

## 2023-01-25 MED ORDER — LAMOTRIGINE 100 MG PO TABS: 50.0000 mg | ORAL_TABLET | Freq: Two times a day (BID) | ORAL | 0 refills | Status: AC

## 2023-01-25 NOTE — Telephone Encounter (Signed)
Patient last seen on 11/05/21 per note "Mark Garner is doing well on lamotrigine  BID. We will continue current plan. " No 1year follow up scheduled  Last filled on 10/28/22 #90 tablets (90 day supply)

## 2023-02-15 DIAGNOSIS — R634 Abnormal weight loss: Secondary | ICD-10-CM | POA: Diagnosis not present

## 2023-02-15 DIAGNOSIS — I5032 Chronic diastolic (congestive) heart failure: Secondary | ICD-10-CM | POA: Diagnosis not present

## 2023-02-15 DIAGNOSIS — I25119 Atherosclerotic heart disease of native coronary artery with unspecified angina pectoris: Secondary | ICD-10-CM | POA: Diagnosis not present

## 2023-02-21 ENCOUNTER — Other Ambulatory Visit: Payer: Self-pay

## 2023-03-27 ENCOUNTER — Other Ambulatory Visit: Payer: Self-pay | Admitting: Cardiovascular Disease

## 2023-04-08 ENCOUNTER — Other Ambulatory Visit: Payer: Self-pay

## 2023-04-08 ENCOUNTER — Emergency Department (HOSPITAL_BASED_OUTPATIENT_CLINIC_OR_DEPARTMENT_OTHER)
Admission: EM | Admit: 2023-04-08 | Discharge: 2023-04-08 | Disposition: A | Discharge status: Home / Self Care | Payer: Medicare Other | Source: Home / Self Care | Attending: Emergency Medicine | Admitting: Emergency Medicine

## 2023-04-08 ENCOUNTER — Emergency Department (HOSPITAL_BASED_OUTPATIENT_CLINIC_OR_DEPARTMENT_OTHER): Payer: Medicare Other

## 2023-04-08 DIAGNOSIS — S0990XA Unspecified injury of head, initial encounter: Secondary | ICD-10-CM

## 2023-04-08 DIAGNOSIS — S80211A Abrasion, right knee, initial encounter: Secondary | ICD-10-CM | POA: Diagnosis not present

## 2023-04-08 DIAGNOSIS — W01190A Fall on same level from slipping, tripping and stumbling with subsequent striking against furniture, initial encounter: Secondary | ICD-10-CM | POA: Insufficient documentation

## 2023-04-08 DIAGNOSIS — S80212A Abrasion, left knee, initial encounter: Secondary | ICD-10-CM | POA: Insufficient documentation

## 2023-04-08 DIAGNOSIS — Z7901 Long term (current) use of anticoagulants: Secondary | ICD-10-CM | POA: Diagnosis not present

## 2023-04-08 DIAGNOSIS — S50311A Abrasion of right elbow, initial encounter: Secondary | ICD-10-CM | POA: Diagnosis not present

## 2023-04-08 DIAGNOSIS — S50312A Abrasion of left elbow, initial encounter: Secondary | ICD-10-CM | POA: Insufficient documentation

## 2023-04-08 DIAGNOSIS — Z7902 Long term (current) use of antithrombotics/antiplatelets: Secondary | ICD-10-CM | POA: Diagnosis not present

## 2023-04-08 DIAGNOSIS — S0101XA Laceration without foreign body of scalp, initial encounter: Secondary | ICD-10-CM

## 2023-04-08 DIAGNOSIS — I6782 Cerebral ischemia: Secondary | ICD-10-CM | POA: Diagnosis not present

## 2023-04-08 MED ORDER — LIDOCAINE-EPINEPHRINE (PF) 2 %-1:200000 IJ SOLN
10.0000 mL | Freq: Once | INTRAMUSCULAR | Status: AC
  Administered 2023-04-08: 10 mL
  Filled 2023-04-08: qty 20

## 2023-04-08 NOTE — ED Notes (Signed)
Patient transported to CT 

## 2023-04-08 NOTE — ED Provider Notes (Signed)
[place] [facility] AT [facility] Provider Note   CSN: [id number] Arrival date & time: 04/08/23  1845     History  Chief Complaint  Patient presents with   Head Injury    Mark Garner is a 77 y.o. male.  Patient who is currently on Eliquis and Plavix presents to the emergency department today after sustaining a head injury and scalp laceration.  Injury occurred at approximately 6:30 PM today.  Patient had his hands full and was trying to open a door.  He lost his footing and fell forward, striking the top of his head on the door itself.  He did not lose consciousness.  He fell and sustained abrasions to his bilateral elbows and bilateral knees.  He was able to ambulate after the injury.  They cleaned at home with soap and water and came due to laceration.  No neck pain, weakness, numbness, or tingling in the arms or the legs.  No headache, vomiting, confusion.       Home Medications Prior to Admission medications   Medication Sig Start Date End Date Taking? Authorizing Provider  acetaminophen (TYLENOL) 500 MG tablet Take 500 mg by mouth every 6 (six) hours as needed (pain).    [provider]  clopidogrel (PLAVIX) 75 MG tablet TAKE 1 TABLET(75 MG) BY MOUTH DAILY 07/02/22   Nahser, Deloris Ping, MD  clotrimazole-betamethasone (LOTRISONE) cream 1 application to affected area 01/22/21   [provider]  ELIQUIS 5 MG TABS tablet TAKE 1 TABLET(5 MG) BY MOUTH TWICE DAILY 11/16/22   Nahser, Deloris Ping, MD  Evolocumab (REPATHA SURECLICK) 140 MG/ML SOAJ INJECT 140MG  INTO THE SKIN EVERY 14 DAYS 05/18/22   Hilty, Lisette Abu, MD  famotidine (PEPCID) 20 MG tablet Take 20 mg by mouth daily.    [provider]  isosorbide mononitrate (IMDUR) 30 MG 24 hr tablet TAKE 1 TABLET(30 MG) BY MOUTH DAILY 03/28/23   Nahser, Deloris Ping, MD  lamoTRIgine (LAMICTAL) 100 MG tablet Take 0.5 tablets (50 mg total) by mouth 2 (two) times daily. 01/25/23   Lomax, Amy, NP  losartan  (COZAAR) 50 MG tablet TAKE 1 TABLET(50 MG) BY MOUTH DAILY 10/27/22   Nahser, Deloris Ping, MD  metoprolol tartrate (LOPRESSOR) 25 MG tablet TAKE 1/2 TABLET(12.5 MG) BY MOUTH TWICE DAILY 11/18/22   Nahser, Deloris Ping, MD  nitroGLYCERIN (NITROSTAT) 0.4 MG SL tablet DISSOLVE 1 TABLET UNDER THE TONGUE EVERY 5 MINUTES AS NEEDED FOR CHEST PAIN 09/09/22   Nahser, Deloris Ping, MD  Simethicone (GAS-X EXTRA STRENGTH PO) Take 1 capsule by mouth 4 (four) times daily as needed (gas).    [provider]      Allergies    Other, Statins, Amoxicillin, Fenofibrate, Gemfibrozil, and Niacin and related    Review of Systems   Review of Systems  Physical Exam Updated Vital Signs BP 133/71 (BP Location: Right Arm)   Pulse 61   Temp 98.4 F (36.9 C) (Oral)   Resp 16   Wt 71.2 kg   SpO2 96%   BMI 23.18 kg/m  Physical Exam Vitals and nursing note reviewed.  Constitutional:      Appearance: He is well-developed.  HENT:     Head: Normocephalic. No raccoon eyes or Battle's sign.     Comments: 2cm laceration to the anterior crown of scalp    Right Ear: Tympanic membrane, ear canal and external ear normal. No hemotympanum.     Left Ear: Tympanic membrane, ear canal and external  ear normal. No hemotympanum.     Nose: Nose normal.  Eyes:     General: Lids are normal.     Conjunctiva/sclera: Conjunctivae normal.     Pupils: Pupils are equal, round, and reactive to light.     Comments: No visible hyphema  Cardiovascular:     Rate and Rhythm: Normal rate and regular rhythm.  Pulmonary:     Effort: Pulmonary effort is normal.     Breath sounds: Normal breath sounds.  Abdominal:     Palpations: Abdomen is soft.     Tenderness: There is no abdominal tenderness.  Musculoskeletal:        General: Normal range of motion.     Cervical back: Normal range of motion and neck supple. No tenderness or bony tenderness.     Thoracic back: No tenderness or bony tenderness.     Lumbar back: No tenderness or bony  tenderness.     Comments: Full active ROM bilateral knees and elbows. Does have mild superficial overlying abrasions.   Skin:    General: Skin is warm and dry.  Neurological:     Mental Status: He is alert and oriented to person, place, and time.     GCS: GCS eye subscore is 4. GCS verbal subscore is 5. GCS motor subscore is 6.     Cranial Nerves: No cranial nerve deficit.     Sensory: No sensory deficit.     Coordination: Coordination normal.     ED Results / Procedures / Treatments   Labs (all labs ordered are listed, but only abnormal results are displayed) Labs Reviewed - No data to display  EKG None  Radiology CT Head Wo Contrast  Result Date: 04/08/2023 CLINICAL DATA:  Head trauma, fall.  Laceration to top of head. EXAM: CT HEAD WITHOUT CONTRAST TECHNIQUE: Contiguous axial images were obtained from the base of the skull through the vertex without intravenous contrast. RADIATION DOSE REDUCTION: This exam was performed according to the departmental dose-optimization program which includes automated exposure control, adjustment of the mA and/or kV according to patient size and/or use of iterative reconstruction technique. COMPARISON:  None Available. FINDINGS: Brain: No acute intracranial hemorrhage, midline shift or mass effect. No extra-axial fluid collection. Periventricular white matter hypodensities are present bilaterally. Mild diffuse atrophy is noted. No hydrocephalus. Vascular: No hyperdense vessel or unexpected calcification. Skull: Normal. Negative for fracture or focal lesion. Sinuses/Orbits: There is partial opacification of the ethmoid air cells on the right anteriorly. No acute orbital abnormality. Other: A scalp hematoma with air is noted over the frontal bones in the midline. IMPRESSION: 1. No acute intracranial hemorrhage. 2. Atrophy with chronic microvascular ischemic changes. Electronically Signed   By: Thornell Sartorius M.D.   On: 04/08/2023 20:10     Procedures .Marland KitchenLaceration Repair  Date/Time: 04/08/2023 8:50 PM  Performed by: Renne Crigler, PA-C Authorized by: Renne Crigler, PA-C   Consent:    Consent obtained:  Verbal   Consent given by:  Patient   Risks discussed:  Infection and pain Universal protocol:    Patient identity confirmed:  Verbally with patient and provided demographic data Anesthesia:    Anesthesia method:  Local infiltration   Local anesthetic:  Lidocaine 2% WITH epi Laceration details:    Location:  Scalp   Scalp location:  Frontal   Length (cm):  2 Exploration:    Imaging obtained comment:  CT   Imaging outcome: foreign body not noted     Wound exploration: wound explored through  full range of motion and entire depth of wound visualized     Wound extent: no foreign body   Treatment:    Area cleansed with:  Shur-Clens   Amount of cleaning:  Standard Skin repair:    Repair method:  Staples   Number of staples:  2 Approximation:    Approximation:  Close Repair type:    Repair type:  Simple Post-procedure details:    Dressing:  Open (no dressing)   Procedure completion:  Tolerated well, no immediate complications     Medications Ordered in ED Medications  lidocaine-EPINEPHrine (XYLOCAINE W/EPI) 2 %-1:200000 (PF) injection 10 mL (10 mLs Infiltration Given by Other 04/08/23 2048)    ED Course/ Medical Decision Making/ A&P    Patient seen and examined. History obtained directly from patient.   Labs/EKG: None ordered  Imaging: Ordered CT head.  Medications/Fluids: None ordered  Most recent vital signs reviewed and are as follows: BP 133/71 (BP Location: Right Arm)   Pulse 61   Temp 98.4 F (36.9 C) (Oral)   Resp 16   Wt 71.2 kg   SpO2 96%   BMI 23.18 kg/m   Initial impression: scalp lac, head injury. Pt awake, alert, no point tenderness of c-spine or decreased ROM to suggest c-spine injury at this time.   8:51 PM Reassessment performed. Patient appears stable.  Tolerated wound  repair without complications.  Imaging personally visualized and interpreted including: CT head, agree negative.  Reviewed pertinent lab work and imaging with patient at bedside. Questions answered.   Most current vital signs reviewed and are as follows: BP 133/71 (BP Location: Right Arm)   Pulse 61   Temp 98.4 F (36.9 C) (Oral)   Resp 16   Wt 71.2 kg   SpO2 96%   BMI 23.18 kg/m   Plan: Discharge to home.   Prescriptions written for: None  Other home care instructions discussed: Wound care for scattered abrasions as well as scalp laceration  ED return instructions discussed: New or worsening symptoms, confusion, vomiting.  Follow-up instructions discussed: Patient encouraged to follow-up with their PCP in 7 days for wound recheck and removal of staples.                              Medical Decision Making Amount and/or Complexity of Data Reviewed Radiology: ordered.  Risk Prescription drug management.   Patient with scalp laceration in setting of anticoagulation and minor head injury.  CT head negative.  No focal neurologic symptoms or decompensation during ED stay.  Scalp laceration repaired.  Full range of motion of all joints of the extremities and I do not suspect extremity fracture.  Fall was mechanical in nature.  Patient stable for discharge to home.  The patient's vital signs, pertinent lab work and imaging were reviewed and interpreted as discussed in the ED course. Hospitalization was considered for further testing, treatments, or serial exams/observation. However as patient is well-appearing, has a stable exam, and reassuring studies today, I do not feel that they warrant admission at this time. This plan was discussed with the patient who verbalizes agreement and comfort with this plan and seems reliable and able to return to the Emergency Department with worsening or changing symptoms.          Final Clinical Impression(s) / ED Diagnoses Final  diagnoses:  Laceration of scalp, initial encounter  Minor head injury, initial encounter    Rx / DC Orders  ED Discharge Orders     None         Renne Crigler, Cordelia Poche 04/08/23 2053    Vanetta Mulders, MD 04/08/23 2126

## 2023-04-08 NOTE — Discharge Instructions (Signed)
Please read and follow all provided instructions.  Your diagnoses today include:  1. Laceration of scalp, initial encounter   2. Minor head injury, initial encounter     Tests performed today include: CT of the head without any acute injuries other than swelling of the scalp Vital signs. See below for your results today.   Medications prescribed:  None  Take any prescribed medications only as directed.   Home care instructions:  Follow any educational materials and wound care instructions contained in this packet.   Keep affected area above the level of your heart when possible to minimize swelling. Wash area gently twice a day with warm soapy water. Do not apply alcohol or hydrogen peroxide. Cover the area if it draining or weeping.   Follow-up instructions: Suture Removal: Return to the Emergency Department or see your primary care care doctor in 6-7 days for a recheck of your wound and removal of your sutures or staples.    Return instructions:  Return to the Emergency Department if you have: Fever Worsening pain Worsening swelling of the wound Pus draining from the wound Redness of the skin that moves away from the wound, especially if it streaks away from the affected area  Any other emergent concerns  Your vital signs today were: BP 133/71 (BP Location: Right Arm)   Pulse 61   Temp 98.4 F (36.9 C) (Oral)   Resp 16   Wt 71.2 kg   SpO2 96%   BMI 23.18 kg/m  If your blood pressure (BP) was elevated above 135/85 this visit, please have this repeated by your doctor within one month. --------------

## 2023-04-08 NOTE — ED Notes (Signed)
Pt ambulated to the bathroom w/ a steady gait.  No issues.

## 2023-04-08 NOTE — ED Notes (Signed)
Provider bedside tending to wound

## 2023-04-08 NOTE — ED Triage Notes (Signed)
Pt arrives after having a fall today. Per pt, he tripped while going down stairs. Pt does take eliquis and plavix. Pt has laceration on top of his head. Pt denies blurry vision. Pt a&ox4. Pt has abrasion on bilateral knees and left elbow.

## 2023-04-10 DIAGNOSIS — S0101XA Laceration without foreign body of scalp, initial encounter: Secondary | ICD-10-CM | POA: Diagnosis not present

## 2023-04-15 DIAGNOSIS — S0101XD Laceration without foreign body of scalp, subsequent encounter: Secondary | ICD-10-CM | POA: Diagnosis not present

## 2023-04-26 ENCOUNTER — Other Ambulatory Visit: Payer: Self-pay | Admitting: Cardiovascular Disease

## 2023-04-30 ENCOUNTER — Other Ambulatory Visit: Payer: Self-pay | Admitting: Cardiovascular Disease

## 2023-04-30 DIAGNOSIS — I480 Paroxysmal atrial fibrillation: Secondary | ICD-10-CM

## 2023-05-02 NOTE — Telephone Encounter (Signed)
Prescription refill request for Eliquis received. Indication: Afib  Last office visit: 12/14/22 ([facility])  Scr: 0.81 (12/14/22)  Age: 77 Weight: 71.2kg  Appropriate dose. Refill sent.

## 2023-05-23 ENCOUNTER — Other Ambulatory Visit: Payer: Self-pay | Admitting: Internal Medicine

## 2023-05-23 DIAGNOSIS — I214 Non-ST elevation (NSTEMI) myocardial infarction: Secondary | ICD-10-CM

## 2023-05-23 DIAGNOSIS — E785 Hyperlipidemia, unspecified: Secondary | ICD-10-CM
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
## 2023-06-21 ENCOUNTER — Encounter: Payer: Self-pay | Admitting: Cardiovascular Disease

## 2023-06-21 ENCOUNTER — Ambulatory Visit: Payer: Medicare Other | Attending: Cardiovascular Disease | Admitting: Cardiovascular Disease

## 2023-06-21 VITALS — BP 124/72 | HR 58 | Ht 69.0 in | Wt 162.8 lb

## 2023-06-21 DIAGNOSIS — I25118 Atherosclerotic heart disease of native coronary artery with other forms of angina pectoris: Secondary | ICD-10-CM | POA: Diagnosis not present

## 2023-06-21 DIAGNOSIS — I2510 Atherosclerotic heart disease of native coronary artery without angina pectoris: Secondary | ICD-10-CM | POA: Diagnosis not present

## 2023-06-21 DIAGNOSIS — I480 Paroxysmal atrial fibrillation: Secondary | ICD-10-CM | POA: Diagnosis not present

## 2023-06-21 DIAGNOSIS — E782 Mixed hyperlipidemia: Secondary | ICD-10-CM | POA: Diagnosis not present

## 2023-06-21 MED ORDER — NITROGLYCERIN 0.4 MG SL SUBL: 0.4000 mg | SUBLINGUAL_TABLET | SUBLINGUAL | 3 refills | Status: DC | PRN

## 2023-06-21 NOTE — Patient Instructions (Signed)
Medication Instructions:   Your physician recommends that you continue on your current medications as directed. Please refer to the Current Medication list given to you today.  *If you need a refill on your cardiac medications before your next appointment, please call your pharmacy*   Lab Work:  TODAY--BMET, LIPIDS, AND ALT  If you have labs (blood work) drawn today and your tests are completely normal, you will receive your results only by: MyChart Message (if you have MyChart) OR A paper copy in the mail If you have any lab test that is abnormal or we need to change your treatment, we will call you to review the results.    Follow-Up: At [facility], you and your health needs are our priority.  As part of our continuing mission to provide you with exceptional heart care, we have created designated Provider Care Teams.  These Care Teams include your primary Cardiologist (physician) and Advanced Practice Providers (APPs -  Physician Assistants and Nurse Practitioners) who all work together to provide you with the care you need, when you need it.  We recommend signing up for the patient portal called "MyChart".  Sign up information is provided on this After Visit Summary.  MyChart is used to connect with patients for Virtual Visits (Telemedicine).  Patients are able to view lab/test results, encounter notes, upcoming appointments, etc.  Non-urgent messages can be sent to your provider as well.   To learn more about what you can do with MyChart, go to [email or web address].    Your next appointment:   1 year(s)  Provider:   Kristeen Miss, MD

## 2023-06-28 ENCOUNTER — Other Ambulatory Visit: Payer: Self-pay | Admitting: Cardiovascular Disease

## 2023-07-04 ENCOUNTER — Ambulatory Visit (HOSPITAL_COMMUNITY)
Admission: RE | Admit: 2023-07-04 | Discharge: 2023-07-04 | Disposition: A | Discharge status: Home / Self Care | Payer: Medicare Other | Source: Ambulatory Visit | Attending: Internal Medicine | Admitting: Internal Medicine

## 2023-07-04 DIAGNOSIS — R42 Dizziness and giddiness: Secondary | ICD-10-CM | POA: Diagnosis not present

## 2023-07-04 DIAGNOSIS — I779 Disorder of arteries and arterioles, unspecified: Secondary | ICD-10-CM

## 2023-07-05 ENCOUNTER — Telehealth: Payer: Self-pay

## 2023-07-05 DIAGNOSIS — I779 Disorder of arteries and arterioles, unspecified: Secondary | ICD-10-CM

## 2023-07-05 NOTE — Telephone Encounter (Signed)
-----   Message from Kristeen Miss sent at 07/04/2023  5:53 PM EDT ----- Mild bilateral carotid artery  disease.  Repeat in 1 year

## 2023-07-05 NOTE — Telephone Encounter (Signed)
Called and spoke with patient who verbalized understanding. Repeat order for next year placed at this time.

## 2023-07-12 ENCOUNTER — Telehealth: Payer: Self-pay | Admitting: Cardiovascular Disease

## 2023-07-12 NOTE — Telephone Encounter (Signed)
After speaking with Tonita Cong, RN, call to patient to advise that he may continue to take an extra dose of metoprolol tartrate (12.5 mg) if he has symptoms between now and his visit with Dr. Elease Hashimoto on 07/26/23. Patient verbalized understanding.

## 2023-07-12 NOTE — Telephone Encounter (Signed)
Patient calling in stating he thinks he may have had 2 episodes of afib. The first episode was 11:30-11:45 PM on 07/09/23. He states he was having some aching and "gentle" chest pressure that woke him up. He checked his fitbit-style (brand is meiomi) watch which stated his heart rate was  "Abnormal" and gave a HR of 108-125 then went back down to 100 bpm or less.  Then on 07/10/23, he was again awakened at 1:40 AM with "gentle " chest pressure and body aches. He states he got up and sat in a chair, took 2 tylenol and checked his watch. His watch told him his HR was "abnormal" and gave a rate of 100 bpm. He took a half tablet of his metoprolol and states all symptoms resolved. He has had no symptoms since and gives his current HR as 70 and his most recent BP at home as 105/70. He states since these 2 episodes he has been able to perform all activity as normal, including golfing and walking up stairs. He is requesting visit with Dr. Elease Hashimoto, appt booked for 07/26/23.

## 2023-07-12 NOTE — Telephone Encounter (Signed)
Patient c/o Palpitations:  High priority if patient c/o lightheadedness, shortness of breath, or chest pain  How long have you had palpitations/irregular HR/ Afib? Are you having the symptoms now? Happened on 10/05 and 10/06  Are you currently experiencing lightheadedness, SOB or CP? No  Do you have a history of afib (atrial fibrillation) or irregular heart rhythm? Yes  Have you checked your BP or HR? (document readings if available): No  Are you experiencing any other symptoms? Patient stated he saw on his wrist band on 10/05 he was in afib for about 10-20 mins and on 10/06 he was in afib for 54 mins. Patient stated he did not feel good and he felt a little "indigestion" in his chest. Patient would like to know if he should be concerned. Please advise.

## 2023-07-25 ENCOUNTER — Encounter: Payer: Self-pay | Admitting: Cardiovascular Disease
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
## 2023-07-26 ENCOUNTER — Ambulatory Visit (INDEPENDENT_AMBULATORY_CARE_PROVIDER_SITE_OTHER): Payer: Medicare Other

## 2023-07-26 ENCOUNTER — Encounter: Payer: Self-pay | Admitting: Cardiovascular Disease

## 2023-07-26 ENCOUNTER — Ambulatory Visit: Payer: Medicare Other | Attending: Cardiovascular Disease | Admitting: Cardiovascular Disease

## 2023-07-26 VITALS — BP 120/62 | HR 82 | Ht 69.0 in | Wt 164.6 lb

## 2023-07-26 DIAGNOSIS — I25118 Atherosclerotic heart disease of native coronary artery with other forms of angina pectoris: Secondary | ICD-10-CM

## 2023-07-26 DIAGNOSIS — I480 Paroxysmal atrial fibrillation: Secondary | ICD-10-CM

## 2023-07-26 DIAGNOSIS — R002 Palpitations: Secondary | ICD-10-CM

## 2023-07-26 NOTE — Patient Instructions (Signed)
Testing/Procedures: ZIO monitor [phone] Your physician has recommended that you wear an event monitor. Event monitors are medical devices that record the heart's electrical activity. Doctors most often [name] these monitors to diagnose arrhythmias. Arrhythmias are problems with the speed or rhythm of the heartbeat. The monitor is a small, portable device. You can wear one while you do your normal daily activities. This is usually used to diagnose what is causing palpitations/syncope (passing out).  Exercise Myoview stress test Your physician has requested that you have en exercise stress myoview. For further information please visit [email or web address] Please follow instruction sheet, as given.  Follow-Up: At [facility], you and your health needs are our priority.  As part of our continuing mission to provide you with exceptional heart care, we have created designated Provider Care Teams.  These Care Teams include your primary Cardiologist (physician) and Advanced Practice Providers (APPs -  Physician Assistants and Nurse Practitioners) who all work together to provide you with the care you need, when you need it.  Your next appointment:   3 month(s)  Provider:   Kristeen Miss, MD       Other [name]  Your physician has requested you wear a ZIO patch monitor for 14 days.  This is a single patch monitor. [employer] supplies one patch monitor per enrollment. Additional stickers are not available. Please do not apply patch if you will be having a Nuclear Stress Test,  Echocardiogram, Cardiac CT, MRI, or Chest Xray during the period you would be wearing the  monitor. The patch cannot be worn during these tests. You cannot remove and re-apply the  ZIO XT patch monitor.  Your ZIO patch monitor will be mailed 3 day USPS to your address on file. It may take 3-5 days  to receive your monitor after you have been enrolled.  Once you have received your  monitor, please review the enclosed Garner. Your monitor  has already been registered assigning a specific monitor serial # to you.  Billing and Patient Assistance Program Information  We have supplied [employer] with any of your insurance information on file for billing purposes. [employer] offers a sliding scale Patient Assistance Program for patients that do not have  insurance, or whose insurance does not completely cover the cost of the ZIO monitor.  You must apply for the Patient Assistance Program to qualify for this discounted rate.  To apply, please call [employer] at [phone], select option 4, select option 2, ask to apply for  Patient Assistance Program. Mark Garner will ask your household income, and how many people  are in your household. They will quote your out-of-pocket cost based on that information.  [employer] will also be able to set up a 9-month, interest-free payment plan if needed.  Applying the monitor   Shave hair from upper left chest.  Hold abrader disc by orange tab. Rub abrader in 40 strokes over the upper left chest as  indicated in your monitor Garner.  Clean area with 4 enclosed alcohol pads. Let dry.  Apply patch as indicated in monitor Garner. Patch will be placed under collarbone on left  side of chest with arrow pointing upward.  Rub patch adhesive wings for 2 minutes. Remove white label marked "1". Remove the white  label marked "2". Rub patch adhesive wings for 2 additional minutes.  While looking in a mirror, press and release button in center of patch. A small green light will  flash 3-4 times. This will  be your only indicator that the monitor has been turned on.  Do not shower for the first 24 hours. You may shower after the first 24 hours.  Press the button if you feel a symptom. You will hear a small click. Record Date, Time and  Symptom in the Patient Logbook.  When you are ready to remove the patch, follow Garner on the last 2  pages of Patient  Logbook. Stick patch monitor onto the last page of Patient Logbook.  Place Patient Logbook in the blue and white box. Use locking tab on box and tape box closed  securely. The blue and white box has prepaid postage on it. Please place it in the mailbox as  soon as possible. Your physician should have your test results approximately 7 days after the  monitor has been mailed back to [facility].  Call [facility] Customer Care at [phone] if you have questions regarding  your ZIO XT patch monitor. Call them immediately if you see an orange light blinking on your  monitor.  If your monitor falls off in less than 4 days, contact our Monitor department at [phone].  If your monitor becomes loose or falls off after 4 days call [employer] at [phone] for  suggestions on securing your monitor

## 2023-07-26 NOTE — Progress Notes (Unsigned)
Applied a 14 day Zio XT monitor to patient in the office ?

## 2023-08-02 ENCOUNTER — Telehealth (HOSPITAL_COMMUNITY): Payer: Self-pay

## 2023-08-02 NOTE — Telephone Encounter (Signed)
Spoke with the patient, detailed instructions given. He stated that would be here for his test. Asked to call back with any questions. S.Miley Lindon CCT

## 2023-08-03 ENCOUNTER — Encounter (HOSPITAL_COMMUNITY): Payer: Medicare Other

## 2023-08-07 ENCOUNTER — Other Ambulatory Visit: Payer: Self-pay | Admitting: Cardiovascular Disease

## 2023-08-08 ENCOUNTER — Ambulatory Visit (HOSPITAL_COMMUNITY): Payer: Medicare Other | Attending: Cardiovascular Disease

## 2023-08-08 DIAGNOSIS — I480 Paroxysmal atrial fibrillation: Secondary | ICD-10-CM | POA: Diagnosis not present

## 2023-08-08 DIAGNOSIS — R002 Palpitations: Secondary | ICD-10-CM

## 2023-08-08 DIAGNOSIS — I25118 Atherosclerotic heart disease of native coronary artery with other forms of angina pectoris: Secondary | ICD-10-CM

## 2023-08-08 LAB — MYOCARDIAL PERFUSION IMAGING
Estimated workload: 7
Exercise duration (min): 5 min
Exercise duration (sec): 15 s
LV dias vol: 61 mL (ref 62–150)
LV sys vol: 23 mL
MPHR: 142 {beats}/min
Nuc Stress EF: 61 %
Peak HR: 151 {beats}/min
Percent HR: 106 %
RPE: 19
Rest HR: 59 {beats}/min
Rest Nuclear Isotope Dose: 8.2 mCi
SDS: 0
SRS: 0
SSS: 0
Stress Nuclear Isotope Dose: 25.3 mCi
TID: 0.82

## 2023-08-08 MED ORDER — TECHNETIUM TC 99M TETROFOSMIN IV KIT
25.3000 | PACK | Freq: Once | INTRAVENOUS | Status: AC | PRN
  Administered 2023-08-08: 25.3 via INTRAVENOUS

## 2023-08-08 MED ORDER — TECHNETIUM TC 99M TETROFOSMIN IV KIT
8.2000 | PACK | Freq: Once | INTRAVENOUS | Status: AC | PRN
  Administered 2023-08-08: 8.2 via INTRAVENOUS

## 2023-08-12 DIAGNOSIS — I25118 Atherosclerotic heart disease of native coronary artery with other forms of angina pectoris: Secondary | ICD-10-CM | POA: Diagnosis not present

## 2023-08-12 DIAGNOSIS — R002 Palpitations: Secondary | ICD-10-CM | POA: Diagnosis not present

## 2023-08-30 ENCOUNTER — Other Ambulatory Visit: Payer: Self-pay | Admitting: Cardiovascular Disease

## 2023-09-27 ENCOUNTER — Other Ambulatory Visit: Payer: Self-pay | Admitting: Cardiovascular Disease

## 2023-10-11 ENCOUNTER — Other Ambulatory Visit: Payer: Self-pay | Admitting: *Deleted

## 2023-10-11 ENCOUNTER — Telehealth: Payer: Self-pay | Admitting: Cardiovascular Disease

## 2023-10-11 DIAGNOSIS — I480 Paroxysmal atrial fibrillation: Secondary | ICD-10-CM

## 2023-10-11 MED ORDER — APIXABAN 5 MG PO TABS
5.0000 mg | ORAL_TABLET | Freq: Two times a day (BID) | ORAL | 1 refills | Status: DC
Start: 2023-10-11 — End: 2023-12-13

## 2023-10-11 NOTE — Telephone Encounter (Signed)
 Eliquis 5mg  refill request received. Patient is 78 years old, weight-74.4kg, Crea-0.87 on 06/21/23, Diagnosis-Afib, and last seen by Dr. Elease Hashimoto on 07/26/23. Dose is appropriate based on dosing criteria. Will send in refill to requested pharmacy.

## 2023-10-11 NOTE — Telephone Encounter (Signed)
*  STAT* If patient is at the pharmacy, call can be transferred to refill team.   1. Which medications need to be refilled? (please list name of each medication and dose if known) ELIQUIS  5 MG TABS tablet   2. Which pharmacy/location (including street and city if local pharmacy) is medication to be sent to? [id number] - [place], [place] - [address]   3. Do they need a 30 day or 90 day supply? 30

## 2023-10-18 DIAGNOSIS — I2510 Atherosclerotic heart disease of native coronary artery without angina pectoris: Secondary | ICD-10-CM | POA: Diagnosis not present

## 2023-10-18 DIAGNOSIS — E7800 Pure hypercholesterolemia, unspecified: Secondary | ICD-10-CM | POA: Diagnosis not present

## 2023-10-18 DIAGNOSIS — Z0001 Encounter for general adult medical examination with abnormal findings: Secondary | ICD-10-CM | POA: Diagnosis not present

## 2023-10-18 DIAGNOSIS — Z79899 Other long term (current) drug therapy: Secondary | ICD-10-CM | POA: Diagnosis not present

## 2023-10-18 DIAGNOSIS — I5032 Chronic diastolic (congestive) heart failure: Secondary | ICD-10-CM | POA: Diagnosis not present

## 2023-10-18 DIAGNOSIS — G629 Polyneuropathy, unspecified: Secondary | ICD-10-CM | POA: Diagnosis not present

## 2023-10-18 DIAGNOSIS — E119 Type 2 diabetes mellitus without complications: Secondary | ICD-10-CM | POA: Diagnosis not present

## 2023-10-18 DIAGNOSIS — I480 Paroxysmal atrial fibrillation: Secondary | ICD-10-CM | POA: Diagnosis not present

## 2023-10-24 ENCOUNTER — Encounter: Payer: Self-pay | Admitting: Cardiovascular Disease

## 2023-10-24 NOTE — Progress Notes (Signed)
   Appt cancelled due to weather    This encounter was created in error - please disregard.

## 2023-10-26 ENCOUNTER — Ambulatory Visit: Payer: Medicare Other | Admitting: Cardiovascular Disease

## 2023-10-27 ENCOUNTER — Encounter: Payer: Self-pay | Admitting: Cardiovascular Disease

## 2023-11-22 DIAGNOSIS — R051 Acute cough: Secondary | ICD-10-CM | POA: Diagnosis not present

## 2023-12-01 DIAGNOSIS — R0981 Nasal congestion: Secondary | ICD-10-CM | POA: Diagnosis not present

## 2023-12-01 DIAGNOSIS — J3489 Other specified disorders of nose and nasal sinuses: Secondary | ICD-10-CM | POA: Diagnosis not present

## 2023-12-12 ENCOUNTER — Encounter: Payer: Self-pay | Admitting: Cardiovascular Disease
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
## 2023-12-13 ENCOUNTER — Ambulatory Visit: Payer: Medicare Other | Attending: Cardiovascular Disease | Admitting: Cardiovascular Disease

## 2023-12-13 VITALS — BP 124/62 | HR 58 | Resp 16 | Ht 69.0 in | Wt 162.8 lb

## 2023-12-13 DIAGNOSIS — I25118 Atherosclerotic heart disease of native coronary artery with other forms of angina pectoris: Secondary | ICD-10-CM | POA: Diagnosis not present

## 2023-12-13 DIAGNOSIS — E782 Mixed hyperlipidemia: Secondary | ICD-10-CM

## 2023-12-13 DIAGNOSIS — I480 Paroxysmal atrial fibrillation: Secondary | ICD-10-CM | POA: Diagnosis not present

## 2023-12-13 MED ORDER — APIXABAN 5 MG PO TABS
5.0000 mg | ORAL_TABLET | Freq: Two times a day (BID) | ORAL | 3 refills | Status: DC
Start: 2023-12-13 — End: 2024-03-22

## 2023-12-13 NOTE — Patient Instructions (Addendum)
 Compared the cost of generic dabigatran 150 mg twice a day  versus your current dose of   Eliquis ( Apixiban)   Lab Work: Lipids, ALT, BMET in 3 months If you have labs (blood work) drawn today and your tests are completely normal, you will receive your results only by: MyChart Message (if you have MyChart) OR A paper copy in the mail If you have any lab test that is abnormal or we need to change your treatment, we will call you to review the results.  Follow-Up: At [facility], you and your health needs are our priority.  As part of our continuing mission to provide you with exceptional heart care, we have created designated Provider Care Teams.  These Care Teams include your primary Cardiologist (physician) and Advanced Practice Providers (APPs -  Physician Assistants and Nurse Practitioners) who all work together to provide you with the care you need, when you need it.  Your next appointment:   1 year(s)  Provider:   Kristeen Miss, MD      1st Floor: - Lobby - Registration  - Pharmacy  - Lab - Cafe  2nd Floor: - PV Lab - Diagnostic Testing (echo, CT, nuclear med)  3rd Floor: - Vacant  4th Floor: - TCTS (cardiothoracic surgery) - AFib Clinic - Structural Heart Clinic - Vascular Surgery  - Vascular Ultrasound  5th Floor: - HeartCare Cardiology (general and EP) - Clinical Pharmacy for coumadin, hypertension, lipid, weight-loss medications, and med management appointments    Valet parking services will be available as well.

## 2024-02-13 DIAGNOSIS — M65351 Trigger finger, right little finger: Secondary | ICD-10-CM | POA: Diagnosis not present

## 2024-02-13 DIAGNOSIS — M65331 Trigger finger, right middle finger: Secondary | ICD-10-CM | POA: Diagnosis not present

## 2024-02-13 DIAGNOSIS — M79641 Pain in right hand: Secondary | ICD-10-CM | POA: Diagnosis not present

## 2024-02-18 IMAGING — CR DG CHEST 2V
2 series · 2 of 2 positions shown · non-contrast
Comparison: 09/12/2014

CLINICAL DATA: Cough for several months, former smoker, history of
coronary stents

EXAM:
CHEST - 2 VIEW

[w chest pa]
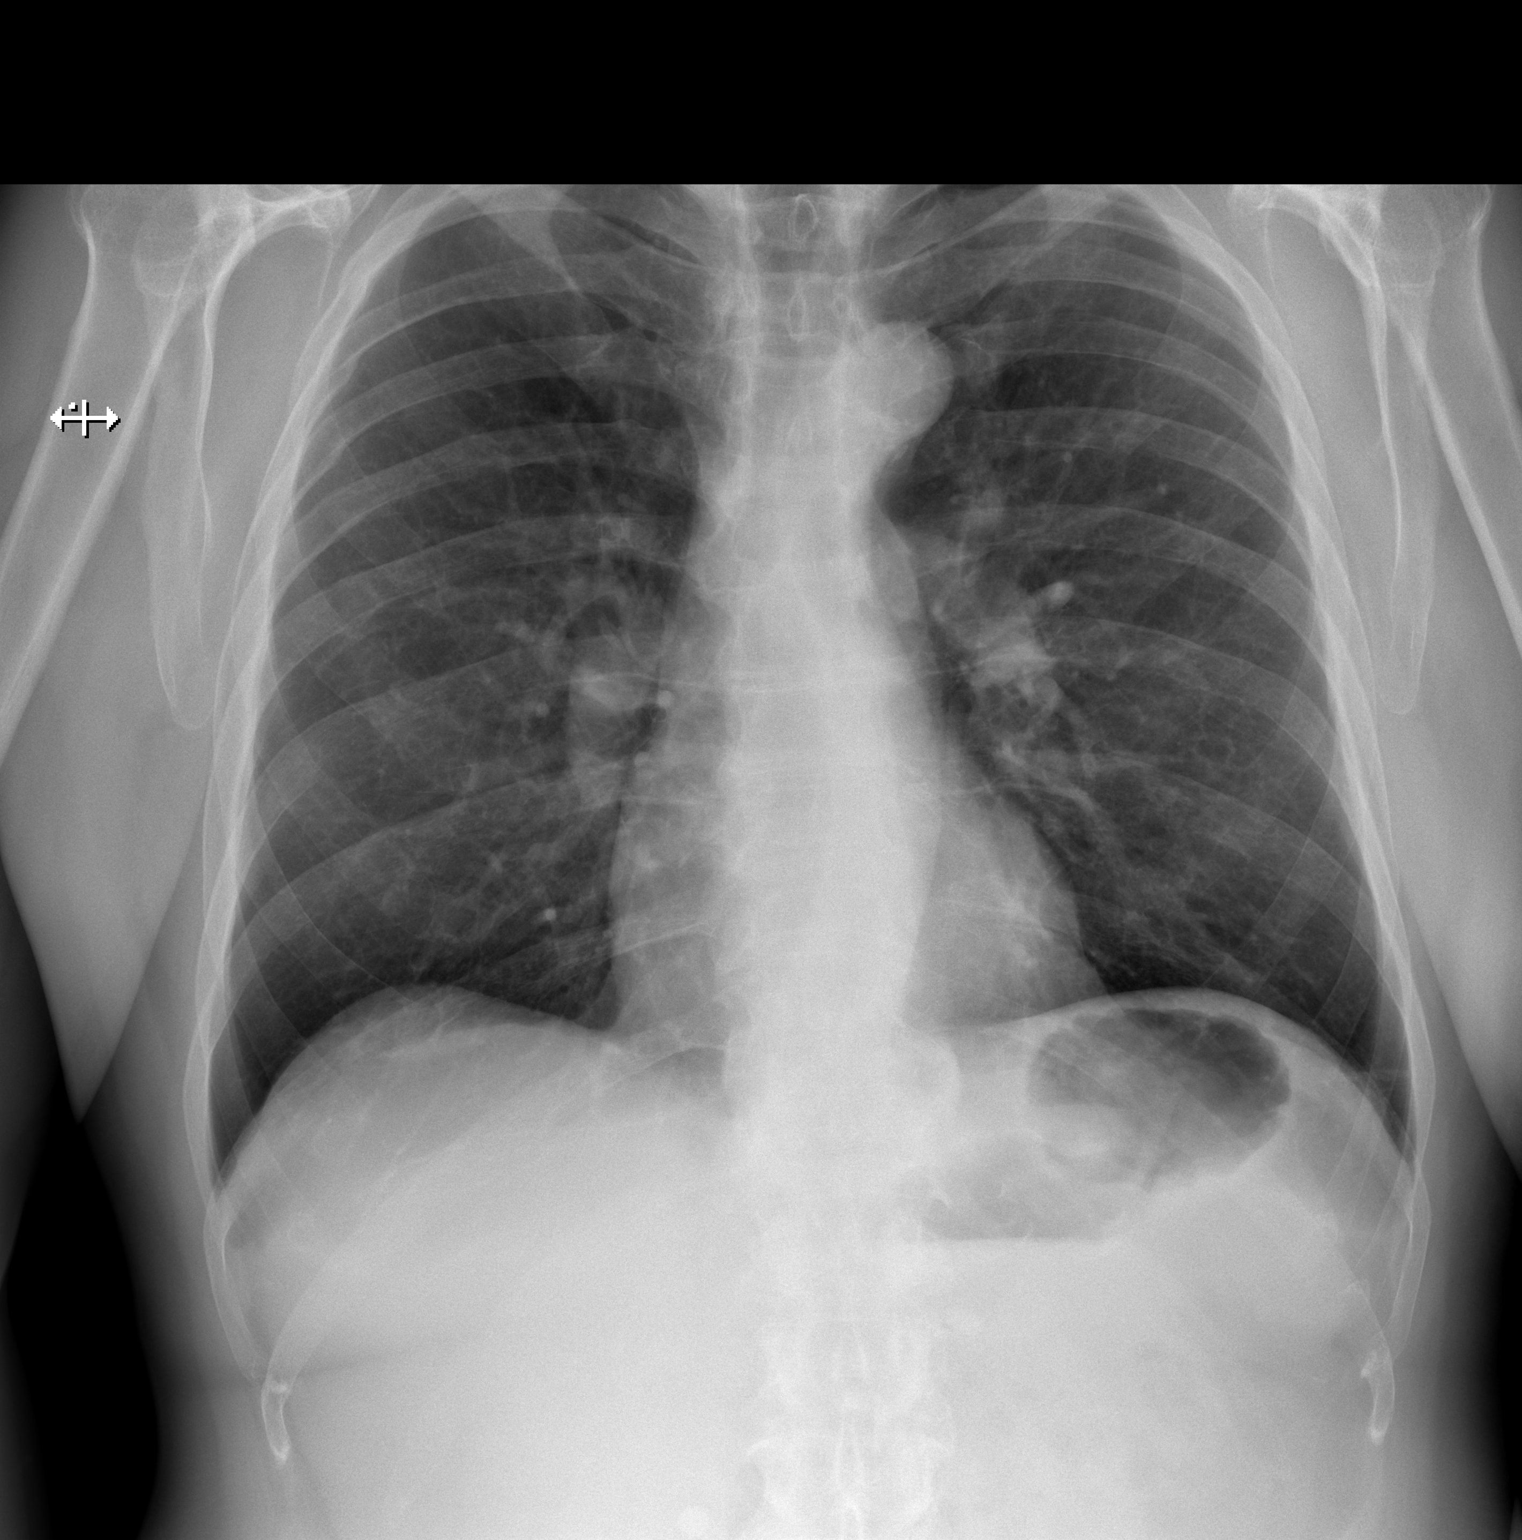

[w chest lat]
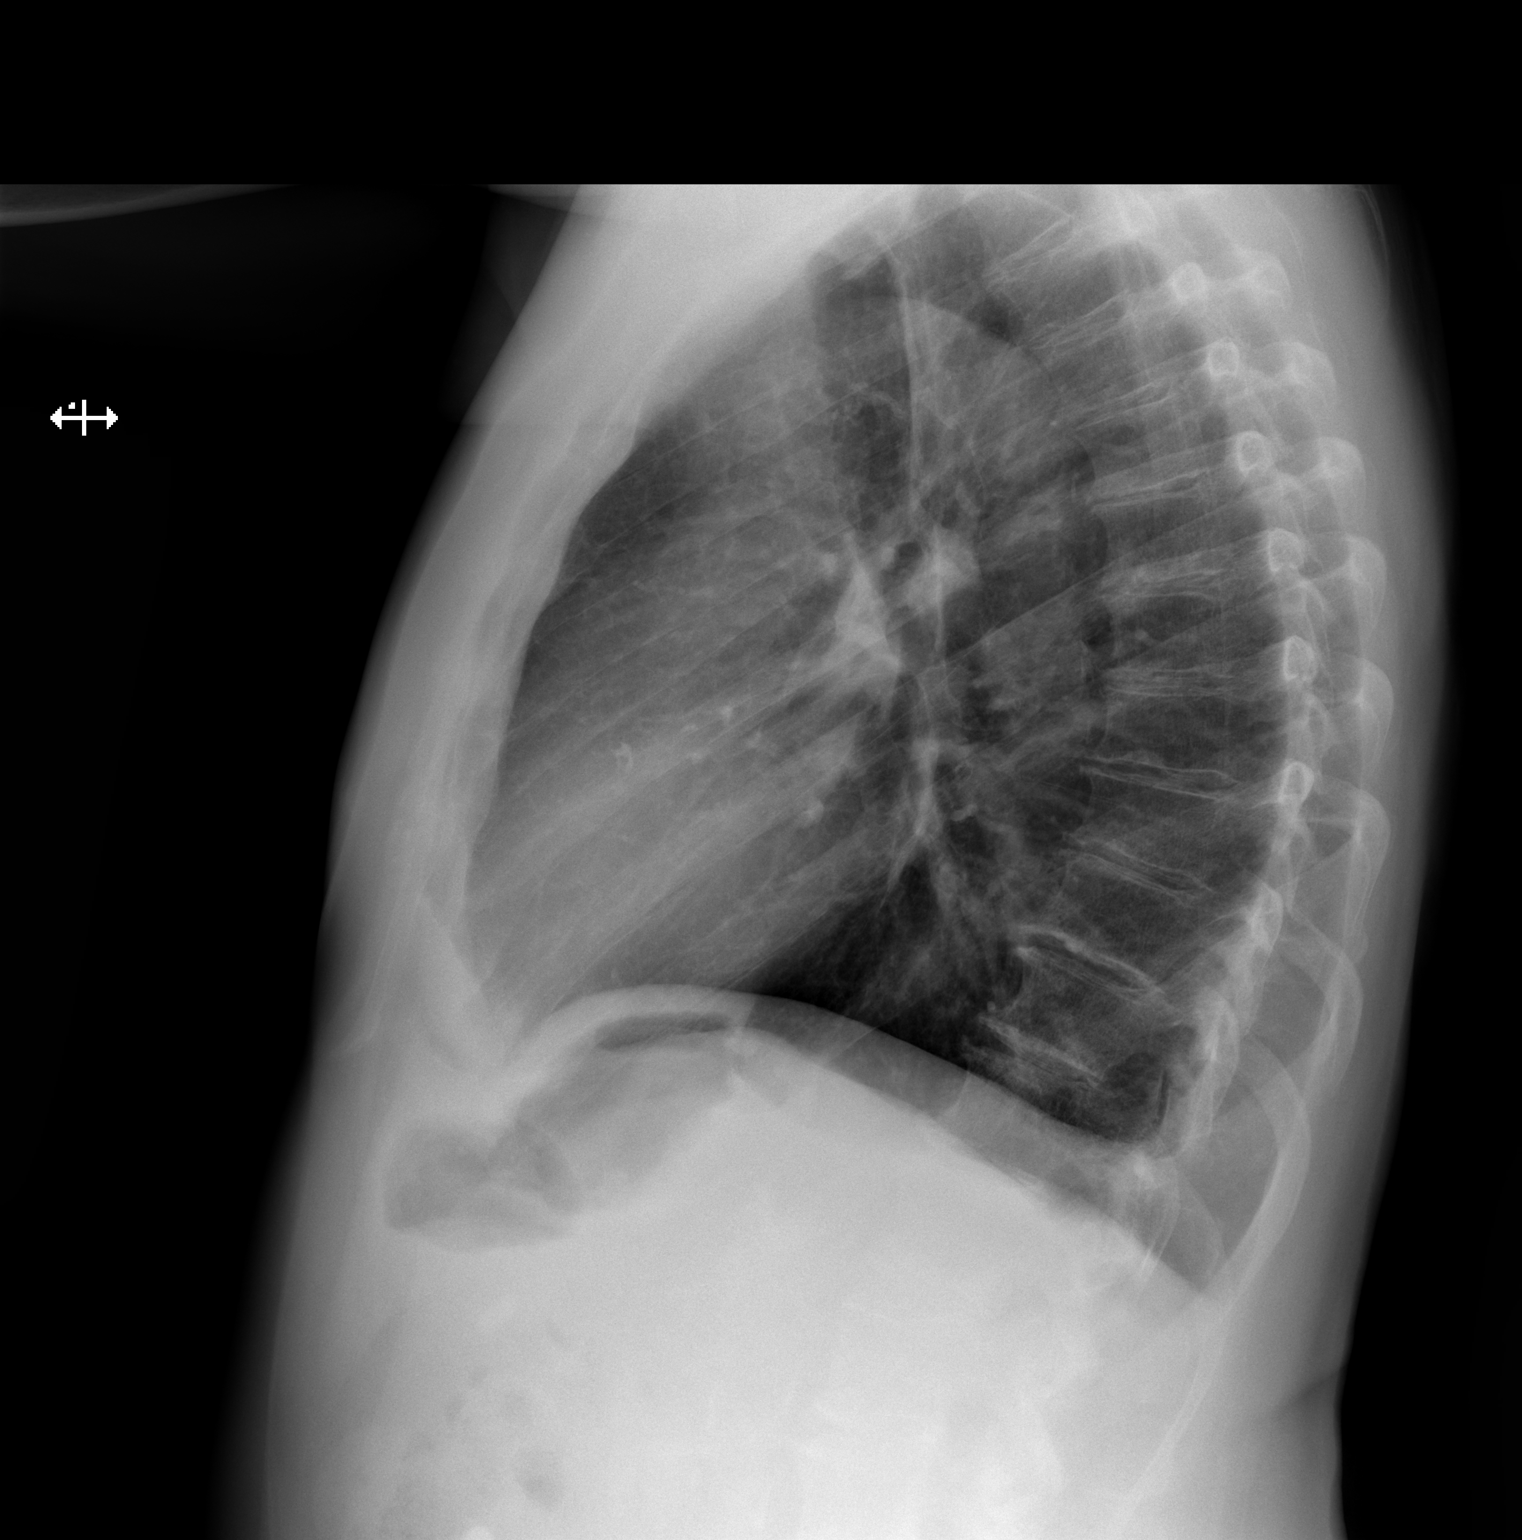

[2 of 2 positions shown; findings below may reference images not displayed]

FINDINGS: Normal heart size, mediastinal contours, and pulmonary vascularity.

Coronary stents noted.

Lungs clear.

No pulmonary infiltrate, pleural effusion, or pneumothorax.

Osseous structures unremarkable.
IMPRESSION: No acute abnormalities.

## 2024-03-14 DIAGNOSIS — D492 Neoplasm of unspecified behavior of bone, soft tissue, and skin: Secondary | ICD-10-CM | POA: Diagnosis not present

## 2024-03-14 DIAGNOSIS — D225 Melanocytic nevi of trunk: Secondary | ICD-10-CM | POA: Diagnosis not present

## 2024-03-14 DIAGNOSIS — L814 Other melanin hyperpigmentation: Secondary | ICD-10-CM | POA: Diagnosis not present

## 2024-03-14 DIAGNOSIS — C44519 Basal cell carcinoma of skin of other part of trunk: Secondary | ICD-10-CM | POA: Diagnosis not present

## 2024-03-14 DIAGNOSIS — D0439 Carcinoma in situ of skin of other parts of face: Secondary | ICD-10-CM | POA: Diagnosis not present

## 2024-03-14 DIAGNOSIS — L821 Other seborrheic keratosis: Secondary | ICD-10-CM | POA: Diagnosis not present

## 2024-03-22 ENCOUNTER — Other Ambulatory Visit: Payer: Self-pay | Admitting: Cardiovascular Disease

## 2024-03-22 DIAGNOSIS — I480 Paroxysmal atrial fibrillation: Secondary | ICD-10-CM

## 2024-03-22 NOTE — Telephone Encounter (Signed)
 Prescription refill request for Eliquis  received. Indication: Afib  Last office visit: 12/13/23 (N[place]ser)  Scr: 0.87 (06/21/23)  Age: 78 Weight: 73.8kg  Appropriate dose. Refill sent.

## 2024-03-29 DIAGNOSIS — M79671 Pain in right foot: Secondary | ICD-10-CM | POA: Diagnosis not present

## 2024-03-29 DIAGNOSIS — M2041 Other hammer toe(s) (acquired), right foot: Secondary | ICD-10-CM | POA: Diagnosis not present

## 2024-03-29 DIAGNOSIS — B351 Tinea unguium: Secondary | ICD-10-CM | POA: Diagnosis not present

## 2024-03-29 DIAGNOSIS — M216X2 Other acquired deformities of left foot: Secondary | ICD-10-CM | POA: Diagnosis not present

## 2024-03-29 DIAGNOSIS — L84 Corns and callosities: Secondary | ICD-10-CM | POA: Diagnosis not present

## 2024-03-29 DIAGNOSIS — M79672 Pain in left foot: Secondary | ICD-10-CM | POA: Diagnosis not present

## 2024-03-29 DIAGNOSIS — M2042 Other hammer toe(s) (acquired), left foot: Secondary | ICD-10-CM | POA: Diagnosis not present

## 2024-04-03 ENCOUNTER — Other Ambulatory Visit: Payer: Self-pay | Admitting: Internal Medicine

## 2024-04-03 DIAGNOSIS — E785 Hyperlipidemia, unspecified: Secondary | ICD-10-CM

## 2024-04-03 DIAGNOSIS — I214 Non-ST elevation (NSTEMI) myocardial infarction: Secondary | ICD-10-CM

## 2024-04-30 ENCOUNTER — Other Ambulatory Visit: Payer: Self-pay

## 2024-04-30 MED ORDER — LOSARTAN POTASSIUM 50 MG PO TABS: ORAL_TABLET | ORAL | 2 refills | Status: AC

## 2024-05-02 DIAGNOSIS — C44519 Basal cell carcinoma of skin of other part of trunk: Secondary | ICD-10-CM | POA: Diagnosis not present

## 2024-05-02 DIAGNOSIS — D0422 Carcinoma in situ of skin of left ear and external auricular canal: Secondary | ICD-10-CM | POA: Diagnosis not present

## 2024-05-07 ENCOUNTER — Telehealth: Payer: Self-pay | Admitting: Cardiovascular Disease

## 2024-05-07 NOTE — Telephone Encounter (Signed)
 Over the past 4 weeks of chest pain/discomfort and pain left side of neck (left shoulder and left arm with the first incident of chest pain) - reports about 6 incidents with activity over the last 4 weeks. Reports no other symptoms with chest pain.  He also had some Afib last week- metoprolol  was increased (given an extra dose with irregular heart beat- and it worked)  No chest pain today  If you experience active Chest Pain, including tightness, pressure, jaw pain, radiating pain to shoulder/upper arm/back, Chest Pain unrelieved by 2-3 doses of Nitroglycerin , Shortness Of Breath, nausea, vomiting, and/or sweating- THEN CALL 911 AND GO TO THE NEAREST EMERGENCY ROOM  DOD appt made with Dr Court [place] 05/09/24. Given new address and floor. He verbalized understanding of all information.

## 2024-05-07 NOTE — Telephone Encounter (Signed)
 Pt c/o of Chest Pain: STAT if active (IN THIS MOMENT) CP, including tightness, pressure, jaw pain, shoulder/upper arm/back pain, SOB, nausea, and vomiting.  1. Are you having CP right now (tightness, pressure, or discomfort)? no  2. Are you experiencing any other symptoms (ex. SOB, nausea, vomiting, sweating)? No   3. How long have you been experiencing CP? 4 weeks   4. Is your CP continuous or coming and going? Coming and going   5. Have you taken Nitroglycerin ? No   6. If CP returns before callback, please consider calling 911. ?

## 2024-05-09 ENCOUNTER — Encounter: Payer: Self-pay | Admitting: Cardiology

## 2024-05-09 ENCOUNTER — Ambulatory Visit: Admitting: Cardiovascular Disease

## 2024-05-09 ENCOUNTER — Other Ambulatory Visit (HOSPITAL_COMMUNITY): Payer: Self-pay

## 2024-05-09 ENCOUNTER — Ambulatory Visit: Attending: Cardiology | Admitting: Cardiology

## 2024-05-09 ENCOUNTER — Telehealth: Payer: Self-pay | Admitting: Cardiovascular Disease

## 2024-05-09 VITALS — BP 133/69 | HR 56 | Resp 16 | Ht 69.0 in | Wt 162.0 lb

## 2024-05-09 DIAGNOSIS — I6523 Occlusion and stenosis of bilateral carotid arteries: Secondary | ICD-10-CM | POA: Insufficient documentation

## 2024-05-09 DIAGNOSIS — E782 Mixed hyperlipidemia: Secondary | ICD-10-CM | POA: Diagnosis not present

## 2024-05-09 DIAGNOSIS — I25118 Atherosclerotic heart disease of native coronary artery with other forms of angina pectoris: Secondary | ICD-10-CM

## 2024-05-09 DIAGNOSIS — I480 Paroxysmal atrial fibrillation: Secondary | ICD-10-CM

## 2024-05-09 LAB — LIPID PANEL

## 2024-05-09 MED ORDER — METOPROLOL TARTRATE 25 MG PO TABS
25.0000 mg | ORAL_TABLET | Freq: Two times a day (BID) | ORAL | 3 refills | Status: DC
  Filled 2024-05-09: qty 180, 90d supply, fill #0

## 2024-05-09 NOTE — Patient Instructions (Addendum)
 Medication Instructions:  INCREASE Metoprolol  to 25 mg daily   *If you need a refill on your cardiac medications before your next appointment, please call your pharmacy*  Lab Work: CBC BMP Lipid panel   If you have labs (blood work) drawn today and your tests are completely normal, you will receive your results only by: MyChart Message (if you have MyChart) OR A paper copy in the mail If you have any lab test that is abnormal or we need to change your treatment, we will call you to review the results.  Testing/Procedures: Carotid doppler   Your physician has requested that you have a carotid duplex. This test is an ultrasound of the carotid arteries in your neck. It looks at blood flow through these arteries that supply the brain with blood. Allow one hour for this exam. There are no restrictions or special instructions. TO BE DONE BEFORE THE 6 WEEK APPOINTMENT WITH DR. PATWARDHAN  Left heart cath   Your physician has requested that you have a cardiac catheterization. Cardiac catheterization is used to diagnose and/or treat various heart conditions. Doctors may recommend this procedure for a number of different reasons. The most common reason is to evaluate chest pain. Chest pain can be a symptom of coronary artery disease (CAD), and cardiac catheterization can show whether plaque is narrowing or blocking your heart's arteries. This procedure is also used to evaluate the valves, as well as measure the blood flow and oxygen levels in different parts of your heart. For further information please visit [email or web address] Please follow instruction sheet, as given.   Follow-Up: At [facility], you and your health needs are our priority.  As part of our continuing mission to provide you with exceptional heart care, our providers are all part of one team.  This team includes your primary Cardiologist (physician) and Advanced Practice Providers or APPs (Physician Assistants and Nurse  Practitioners) who all work together to provide you with the care you need, when you need it.  Your next appointment:   2 weeks post cath with APP  6 MONTHS WITH DR. PATWARDHAN    [facility] A DEPT OF [place] [address] A DEPT OF THE [place] [address] [place] [address] Dept: [phone] Loc: [phone]  ANIKET PAYE  05/09/2024  You are scheduled for a Cardiac Catheterization on Tuesday, August 12 with Dr. Newman Lawrence.  1. Please arrive at the [facility] (Main Entrance A) at [facility]: [address] [place], [place] [address] at 8:30 AM (This time is 2 hour(s) before your procedure to ensure your preparation).   Free valet parking service is available. You will check in at ADMITTING. The support person will be asked to wait in the waiting room.  It is OK to have someone drop you off and come back when you are ready to be discharged.    Special note: Every effort is made to have your procedure done on time. Please understand that emergencies sometimes delay scheduled procedures.  2. Diet: No solid foods after midnight. You may have clear liquids until you arrive at the hospital.  List of approved liquids water, clear juice, clear tea, black coffee, fruit juices, non-citric and without pulp, carbonated beverages, Gatorade, Kool -Aid, plain Jello-O and plain ice popsicles.   3. Hydration: You need to be well hydrated before your procedure time. You may drink approved liquids (see below) until you arrive  at the hospital. On the way to the hospital, please drink a 16-oz (1 plastic bottle) of water .   List of approved liquids water , clear juice, clear tea, black coffee, fruit juices, non-citric and without pulp, carbonated beverages, Gatorade, Kool -Aid, plain Jello-O and plain ice popsicles.  4. Labs: today  5. Medication instructions in preparation for your procedure:   Contrast Allergy:  No  Stop taking Eliquis  (Apixiban) on Sunday, August 10.  On the morning of your procedure, take your Plavix /Clopidogrel  and any morning medicines NOT listed above.  You may use sips of water .  6. Plan to go home the same day, you will only stay overnight if medically necessary. 7. Bring a current list of your medications and current insurance cards. 8. You MUST have a responsible person to drive you home. 9. Someone MUST be with you the first 24 hours after you arrive home or your discharge will be delayed. 10. Please wear clothes that are easy to get on and off and wear slip-on shoes.  Thank you for allowing us  to care for you!   -- [place]

## 2024-05-09 NOTE — Telephone Encounter (Signed)
 Patient will see Dr Elmira today

## 2024-05-09 NOTE — Telephone Encounter (Signed)
 Reason for walk-in: Walk-in Reasons: requesting to be seen today (pt's appt was canceled yesterday - see cancellation reason; needs further instruction on which provider to schedule appt, if not with Dr. Court)  If patient is requesting to be seen today, or if patient is having symptoms:  What symptoms are being reported (if any)?  N/A  Route to triage pool and ensure Teams message has been sent to the Triage Walk-In chat.  3.   For medication samples, medication refills, HIM requests, appointment requests, lab-related requests, or form/record drop-off, please route to the appropriate pool.

## 2024-05-09 NOTE — Progress Notes (Signed)
 Cardiology Office Note:  .   Date:  05/09/2024  ID:  Mark Garner, DOB July 23, 1946, MRN [id number] PCP: Mark Slater, MD  [facility] Providers Cardiologist:  Mark Lawrence, MD PCP: Mark Slater, MD  Chief Complaint  Patient presents with   Coronary artery disease of native artery of native heart wi     Mark Garner is a 78 y.o. male with mixed hyperlipidemia, coronary artery disease, paroxysmal atrial fibrillation  History of Present Illness  Patient previously saw Dr. Alveta, last seen in 12/2023.  Recently, patient's physical activity has gone down as he is caring for his wife recovering from hip surgery.  In the past, he used to walk several miles and bike regularly.  However, recently, he has noticed episodes of chest pressure with much less intensity physical activity such as walking from bathroom to his bed.  This is unusual for the patient and is hoping to get back on the Trail and walk couple miles a day.  Reviewed prior cardiac catheterization images most recently from 2021 when he had NSTEMI and PCI to OM.  There is residual disease in RCA.  On a separate note, his A-fib episodes are fairly infrequent occured only few times in last couple years.      Vitals:   05/09/24 1540  BP: 133/69  Pulse: (!) 56  Resp: 16  SpO2: 97%      Review of Systems  Cardiovascular:  Positive for chest pain. Negative for dyspnea on exertion, leg swelling, palpitations and syncope.        Studies Reviewed: Mark Garner    EKG 05/09/2024: Sinus rhythm 56 bpm Right bundle branch block Left anterior fascicular block Bifascicular block Septal infarct (cited on or before 11-May-2022) When compared with ECG of 13-Dec-2023 11:03, No significant change was found  Long-term monitor 08/2023:   Predominant rhythm is sinus rhythm.   70 episodes of nonsustained supraventricular tachycardia.  the fasterest interval lasted 5 beats with a max HR of 218.  The longest episode lasted  17 seconds with Avg HR of 100   Rare PACs, Rare PVCs    Stress test 08/2023:   The study is normal. The study is low risk.   Fair exercise capacity, achieved 7.0 METS   Peak heart rate 151 bpm, 106% max age-predicted heart rate   Hypertensive response to exercise, peak BP 209/99   horizontal ST depression (V3) was noted.   LV perfusion is normal. There is no evidence of ischemia. There is no evidence of infarction.   Left ventricular function is normal. Nuclear stress EF: 61%. The left ventricular ejection fraction is normal (55-65%). End diastolic cavity size is normal. End systolic cavity size is normal.   Prior study available for comparison from 05/11/2022.   Fixed inferior perfusion defect with normal wall motion, consistent with artifact LVEF 61% Low risk study  Echocardiogram 2023:  1. Left ventricular ejection fraction, by estimation, is 60 to 65%. The  left ventricle has normal function. The left ventricle has no regional  wall motion abnormalities. Left ventricular diastolic parameters are  consistent with Grade I diastolic  dysfunction (impaired relaxation).   2. Right ventricular systolic function is normal. The right ventricular  size is normal.   3. The mitral valve is normal in structure. Trivial mitral valve  regurgitation. No evidence of mitral stenosis.   4. The aortic valve is calcified. Aortic valve regurgitation is not  visualized. Aortic valve sclerosis/calcification is present, without any  evidence of  aortic stenosis.   5. The inferior vena cava is normal in size with greater than 50%  respiratory variability, suggesting right atrial pressure of 3 mmHg.   Coronary angiogram 2020: 1. Multivessel CAD in a diffuse, small vessel disease pattern 2. Severe mid-circumflex stenosis treated successfully with PTCA and DES implantation (2.25x22 mm Resolute Onyx) 3. Severe mid and distal LAD stenoses successfully treated with PTCA and stenting using a 2.5x22 mm Resolute  Onyx and 2.0x15 mm Resolute Onyx, respectively 4. Diffuse nonobstructive RCA stenosis with severe stenosis of the ostial right PDA not favorable for PCI in small vessel with ostial lesion location 5. Moderate residual disease in the ramus intermedius   Recommend: 2D Echo for LVEF assessment, ASA/ticagrelor  x 12 months minimum, med Rx for residual CAD, aggressive lipid lowering as tolerated    Labs 06/2023-10/2023: Chol 205, TG 206, HDL 35, LDL 133 HbA1C 6.1% Hb 13.2 Cr 0.8 TSH 2.7  Risk Assessment/Calculations:    CHA2DS2-VASc Score = 4   This indicates a 4.8% annual risk of stroke. The patient's score is based upon: CHF History: 0 HTN History: 1 Diabetes History: 0 Stroke History: 0 Vascular Disease History: 1 Age Score: 2 Gender Score: 0     Physical Exam Vitals and nursing note reviewed.  Constitutional:      General: He is not in acute distress. Neck:     Vascular: No JVD.  Cardiovascular:     Rate and Rhythm: Normal rate and regular rhythm.     Heart sounds: Normal heart sounds. No murmur heard. Pulmonary:     Effort: Pulmonary effort is normal.     Breath sounds: Normal breath sounds. No wheezing or rales.  Musculoskeletal:     Right lower leg: No edema.     Left lower leg: No edema.      VISIT DIAGNOSES:   ICD-10-CM   1. Coronary artery disease of native artery of native heart with stable angina pectoris (HCC)  I25.118 EKG 12-Lead    CBC    Basic metabolic panel with GFR    2. PAF (paroxysmal atrial fibrillation) (HCC)  I48.0     3. Mixed hyperlipidemia  E78.2 Lipid panel    4. Bilateral carotid artery stenosis  I65.23 VAS US  CAROTID       Mark Garner is a 78 y.o. male with mixed hyperlipidemia, coronary artery disease, paroxysmal atrial fibrillation Assessment & Plan  CAD: Recent episodes of angina with much less intensity of physical activity than what is usual for the patient, thus fits the definition for unstable or accelerated  angina. High pretest probability for obstructive CAD. Recommend coronary angiography with possible intervention. Continue Plavix  75 mg daily. Increase metoprolol  to tartrate to 25 mg twice daily, continue Imdur  30 mg daily. Continue Repatha , there has been some noncompliance in the recent past. Check CBC, BMP, lipid panel. If he undergoes PCI, I will likely add aspirin  for 1 week, and then revert back to Plavix  and Eliquis  without Aspirin .  PAF: Continue Eliquis  5 mg twice daily. Increase metoprolol  to tartrate to 25 mg twice daily for anginal symptoms.  Carotid stenosis: Mild in 2024.  Repeat carotid plaques in 6 months. Continue medical management as above.  Informed Consent   Shared Decision Making/Informed Consent The risks [stroke (1 in 1000), death (1 in 1000), kidney failure [usually temporary] (1 in 500), bleeding (1 in 200), allergic reaction [possibly serious] (1 in 200)], benefits (diagnostic support and management of coronary artery disease) and alternatives of a  cardiac catheterization were discussed in detail with Mr. Mcnutt and he is willing to proceed.       Meds ordered this encounter  Medications   metoprolol  tartrate (LOPRESSOR ) 25 MG tablet    Sig: Take 1 tablet (25 mg total) by mouth 2 (two) times daily.    Dispense:  180 tablet    Refill:  3    ZERO refills remain on this prescription. Your patient is requesting advance approval of refills for this medication to PREVENT ANY MISSED DOSES; 8/6 ok to change quantity to 180 per RN Lyle Rigg     F/u after cath F/u w/me in 6 months  Signed, Mark JINNY Lawrence, MD

## 2024-05-10 LAB — BASIC METABOLIC PANEL WITH GFR
BUN/Creatinine Ratio: 20 (ref 10–24)
BUN: 18 mg/dL (ref 8–27)
CO2: 21 mmol/L (ref 20–29)
Calcium: 9.8 mg/dL (ref 8.6–10.2)
Chloride: 98 mmol/L (ref 96–106)
Creatinine, Ser: 0.88 mg/dL (ref 0.76–1.27)
Glucose: 98 mg/dL (ref 70–99)
Potassium: 5.2 mmol/L (ref 3.5–5.2)
Sodium: 134 mmol/L (ref 134–144)
eGFR: 87 mL/min/1.73 (ref >59)

## 2024-05-10 LAB — LIPID PANEL
Cholesterol, Total: 128 mg/dL (ref 100–199)
HDL: 38 mg/dL — AB (ref >39)
LDL CALC COMMENT:: 3.4 ratio (ref 0.0–5.0)
LDL Chol Calc (NIH): 54 mg/dL (ref 0–99)
Triglycerides: 223 mg/dL — AB (ref 0–149)
VLDL Cholesterol Cal: 36 mg/dL (ref 5–40)

## 2024-05-10 LAB — CBC
Hematocrit: 41.4 % (ref 37.5–51.0)
Hemoglobin: 13.7 g/dL (ref 13.0–17.7)
MCH: 31.3 pg (ref 26.6–33.0)
MCHC: 33.1 g/dL (ref 31.5–35.7)
MCV: 95 fL (ref 79–97)
Platelets: 389 x10E3/uL (ref 150–450)
RBC: 4.38 x10E6/uL (ref 4.14–5.80)
RDW: 13 % (ref 11.6–15.4)
WBC: 9.5 x10E3/uL (ref 3.4–10.8)

## 2024-05-13 ENCOUNTER — Ambulatory Visit: Payer: Self-pay | Admitting: Cardiology

## 2024-05-13 DIAGNOSIS — E782 Mixed hyperlipidemia: Secondary | ICD-10-CM

## 2024-05-13 NOTE — Progress Notes (Signed)
 LDL is well controlled. Triglyceride mildly elevated/ Repeat fasting lipid panel in 2 months.  Thanks MJP

## 2024-05-14 ENCOUNTER — Telehealth: Payer: Self-pay | Admitting: *Deleted

## 2024-05-14 NOTE — Telephone Encounter (Addendum)
 Cardiac Catheterization scheduled at [facility] for: Tuesday May 15, 2024 10:30 AM Arrival time [facility] Main Entrance A at: 8:30 AM  Diet: -Nothing to eat after midnight prior to procedure.  Hydration: -May drink clear liquids until leaving for hospital. Approved liquids: Water , clear tea, black coffee, fruit juices-non-citric and without pulp,Gatorade, plain Jello/popsicles.  Drink 16 oz. bottle of water  on the way to the hospital.  Medication instructions: -Hold:  Eliquis -none 05/13/24 until post procedure  -Other usual morning medications can be taken including aspirin  81 mg and Plavix  75 .  Plan to go home the same day, you will only stay overnight if medically necessary.  You must have responsible adult to drive you home.  Someone must be with you the first 24 hours after you arrive home.  Reviewed procedure instructions with patient.

## 2024-05-15 ENCOUNTER — Encounter (HOSPITAL_COMMUNITY): Payer: Self-pay | Admitting: Cardiology

## 2024-05-15 ENCOUNTER — Other Ambulatory Visit: Payer: Self-pay

## 2024-05-15 ENCOUNTER — Ambulatory Visit (HOSPITAL_COMMUNITY)
Admission: RE | Admit: 2024-05-15 | Discharge: 2024-05-16 | Disposition: A | Discharge status: Home / Self Care | Attending: Cardiology | Admitting: Cardiology

## 2024-05-15 ENCOUNTER — Encounter (HOSPITAL_COMMUNITY)
Admission: RE | Disposition: A | Discharge status: Home / Self Care | Payer: Self-pay | Source: Home / Self Care | Attending: Cardiology

## 2024-05-15 DIAGNOSIS — Z7902 Long term (current) use of antithrombotics/antiplatelets: Secondary | ICD-10-CM | POA: Insufficient documentation

## 2024-05-15 DIAGNOSIS — Z79899 Other long term (current) drug therapy: Secondary | ICD-10-CM | POA: Insufficient documentation

## 2024-05-15 DIAGNOSIS — Z955 Presence of coronary angioplasty implant and graft: Secondary | ICD-10-CM

## 2024-05-15 DIAGNOSIS — Z006 Encounter for examination for normal comparison and control in clinical research program: Secondary | ICD-10-CM

## 2024-05-15 DIAGNOSIS — E782 Mixed hyperlipidemia: Secondary | ICD-10-CM | POA: Insufficient documentation

## 2024-05-15 DIAGNOSIS — Z7901 Long term (current) use of anticoagulants: Secondary | ICD-10-CM | POA: Diagnosis not present

## 2024-05-15 DIAGNOSIS — Y832 Surgical operation with anastomosis, bypass or graft as the cause of abnormal reaction of the patient, or of later complication, without mention of misadventure at the time of the procedure: Secondary | ICD-10-CM | POA: Insufficient documentation

## 2024-05-15 DIAGNOSIS — I252 Old myocardial infarction: Secondary | ICD-10-CM | POA: Insufficient documentation

## 2024-05-15 DIAGNOSIS — I10 Essential (primary) hypertension: Secondary | ICD-10-CM | POA: Diagnosis not present

## 2024-05-15 DIAGNOSIS — I25118 Atherosclerotic heart disease of native coronary artery with other forms of angina pectoris: Secondary | ICD-10-CM

## 2024-05-15 DIAGNOSIS — I25110 Atherosclerotic heart disease of native coronary artery with unstable angina pectoris: Secondary | ICD-10-CM | POA: Diagnosis not present

## 2024-05-15 DIAGNOSIS — T82855A Stenosis of coronary artery stent, initial encounter: Secondary | ICD-10-CM | POA: Insufficient documentation

## 2024-05-15 DIAGNOSIS — D72829 Elevated white blood cell count, unspecified: Secondary | ICD-10-CM | POA: Insufficient documentation

## 2024-05-15 DIAGNOSIS — I2510 Atherosclerotic heart disease of native coronary artery without angina pectoris: Secondary | ICD-10-CM | POA: Diagnosis present

## 2024-05-15 DIAGNOSIS — E785 Hyperlipidemia, unspecified: Secondary | ICD-10-CM | POA: Diagnosis present

## 2024-05-15 DIAGNOSIS — I480 Paroxysmal atrial fibrillation: Secondary | ICD-10-CM | POA: Diagnosis present

## 2024-05-15 DIAGNOSIS — I6529 Occlusion and stenosis of unspecified carotid artery: Secondary | ICD-10-CM | POA: Insufficient documentation

## 2024-05-15 HISTORY — PX: LEFT HEART CATH AND CORONARY ANGIOGRAPHY: CATH118249

## 2024-05-15 HISTORY — PX: CORONARY STENT INTERVENTION: CATH118234

## 2024-05-15 HISTORY — PX: CORONARY ULTRASOUND/IVUS: CATH118244

## 2024-05-15 HISTORY — PX: CORONARY PRESSURE/FFR WITH 3D MAPPING: CATH118309

## 2024-05-15 LAB — POCT ACTIVATED CLOTTING TIME
Activated Clotting Time: 302 s
Activated Clotting Time: 227 s
Activated Clotting Time: 326 s
Activated Clotting Time: 256 s
Activated Clotting Time: 256 s
Activated Clotting Time: 279 s
Activated Clotting Time: 366 s

## 2024-05-15 SURGERY — LEFT HEART CATH AND CORONARY ANGIOGRAPHY
Anesthesia: LOCAL

## 2024-05-15 MED ORDER — SODIUM CHLORIDE 0.9% FLUSH: 3.0000 mL | INTRAVENOUS | Status: DC | PRN

## 2024-05-15 MED ORDER — IOHEXOL 350 MG/ML SOLN
INTRAVENOUS | Status: DC | PRN
Start: 2024-05-15 — End: 2024-05-15
  Administered 2024-05-15 (×2): 210 mL

## 2024-05-15 MED ORDER — ASPIRIN 81 MG PO TBEC: 81.0000 mg | DELAYED_RELEASE_TABLET | Freq: Every day | ORAL | 0 refills | Status: DC

## 2024-05-15 MED ORDER — NITROGLYCERIN 1 MG/10 ML FOR IR/CATH LAB
INTRA_ARTERIAL | Status: AC
Start: 2024-05-15 — End: 2024-05-15
  Filled 2024-05-15: qty 10

## 2024-05-15 MED ORDER — SODIUM CHLORIDE 0.9 % IV SOLN: 250.0000 mL | INTRAVENOUS | Status: DC | PRN

## 2024-05-15 MED ORDER — NITROGLYCERIN 1 MG/10 ML FOR IR/CATH LAB
INTRA_ARTERIAL | Status: DC | PRN
  Administered 2024-05-15: 200 ug via INTRACORONARY
  Administered 2024-05-15: 300 ug via INTRACORONARY
  Administered 2024-05-15 (×4): 200 ug via INTRACORONARY
  Administered 2024-05-15: 300 ug via INTRACORONARY
  Administered 2024-05-15: 200 ug via INTRACORONARY

## 2024-05-15 MED ORDER — FENTANYL CITRATE (PF) 100 MCG/2ML IJ SOLN
INTRAMUSCULAR | Status: DC | PRN
  Administered 2024-05-15 (×4): 50 ug via INTRAVENOUS
  Administered 2024-05-15 (×2): 25 ug via INTRAVENOUS

## 2024-05-15 MED ORDER — MIDAZOLAM HCL 2 MG/2ML IJ SOLN
INTRAMUSCULAR | Status: DC | PRN
  Administered 2024-05-15 (×6): 1 mg via INTRAVENOUS

## 2024-05-15 MED ORDER — VERAPAMIL HCL 2.5 MG/ML IV SOLN
INTRAVENOUS | Status: AC
Start: 2024-05-15 — End: 2024-05-15
  Filled 2024-05-15: qty 2

## 2024-05-15 MED ORDER — HYDRALAZINE HCL 20 MG/ML IJ SOLN
INTRAMUSCULAR | Status: DC | PRN
  Administered 2024-05-15 (×2): 10 mg via INTRAVENOUS

## 2024-05-15 MED ORDER — FENTANYL CITRATE (PF) 100 MCG/2ML IJ SOLN
INTRAMUSCULAR | Status: AC
  Filled 2024-05-15: qty 2

## 2024-05-15 MED ORDER — SODIUM CHLORIDE 0.9 % IV SOLN: INTRAVENOUS | Status: AC

## 2024-05-15 MED ORDER — HYDRALAZINE HCL 20 MG/ML IJ SOLN
INTRAMUSCULAR | Status: AC
  Filled 2024-05-15: qty 1

## 2024-05-15 MED ORDER — MIDAZOLAM HCL 2 MG/2ML IJ SOLN
INTRAMUSCULAR | Status: AC
  Filled 2024-05-15: qty 2

## 2024-05-15 MED ORDER — HEPARIN SODIUM (PORCINE) 1000 UNIT/ML IJ SOLN
INTRAMUSCULAR | Status: AC
  Filled 2024-05-15: qty 10

## 2024-05-15 MED ORDER — ACETAMINOPHEN 325 MG PO TABS: 650.0000 mg | ORAL_TABLET | ORAL | Status: DC | PRN

## 2024-05-15 MED ORDER — LIDOCAINE HCL (PF) 1 % IJ SOLN
INTRAMUSCULAR | Status: AC
  Filled 2024-05-15: qty 30

## 2024-05-15 MED ORDER — SODIUM CHLORIDE 0.9 % IV SOLN
INTRAVENOUS | Status: AC | PRN
  Administered 2024-05-15 (×2): 10 mL/h via INTRAVENOUS

## 2024-05-15 MED ORDER — LABETALOL HCL 5 MG/ML IV SOLN: 10.0000 mg | INTRAVENOUS | Status: AC | PRN

## 2024-05-15 MED ORDER — HYDRALAZINE HCL 20 MG/ML IJ SOLN: 10.0000 mg | INTRAMUSCULAR | Status: AC | PRN

## 2024-05-15 MED ORDER — HEPARIN (PORCINE) IN NACL 1000-0.9 UT/500ML-% IV SOLN
INTRAVENOUS | Status: DC | PRN
  Administered 2024-05-15: 1000 mL
  Administered 2024-05-15: 500 mL
  Administered 2024-05-15: 1000 mL
  Administered 2024-05-15: 500 mL

## 2024-05-15 MED ORDER — FREE WATER
500.0000 mL | Freq: Once | Status: AC
  Administered 2024-05-15 (×2): 500 mL via ORAL

## 2024-05-15 MED ORDER — HEPARIN SODIUM (PORCINE) 1000 UNIT/ML IJ SOLN
INTRAMUSCULAR | Status: DC | PRN
  Administered 2024-05-15 (×8): 4000 [IU] via INTRAVENOUS

## 2024-05-15 MED ORDER — ONDANSETRON HCL 4 MG/2ML IJ SOLN: 4.0000 mg | Freq: Four times a day (QID) | INTRAMUSCULAR | Status: DC | PRN

## 2024-05-15 MED ORDER — CLOPIDOGREL BISULFATE 75 MG PO TABS: 75.0000 mg | ORAL_TABLET | ORAL | Status: DC

## 2024-05-15 MED ORDER — SODIUM CHLORIDE 0.9% FLUSH: 3.0000 mL | Freq: Two times a day (BID) | INTRAVENOUS | Status: DC

## 2024-05-15 MED ORDER — SODIUM CHLORIDE 0.9% FLUSH
3.0000 mL | Freq: Two times a day (BID) | INTRAVENOUS | Status: DC
  Administered 2024-05-16 (×2): 3 mL via INTRAVENOUS

## 2024-05-15 MED ORDER — ADENOSINE (DIAGNOSTIC) FOR INTRACORONARY USE: INTRAVENOUS | Status: DC | PRN

## 2024-05-15 MED ORDER — ASPIRIN 81 MG PO CHEW: 81.0000 mg | CHEWABLE_TABLET | ORAL | Status: DC

## 2024-05-15 MED ORDER — ADENOSINE 12 MG/4ML IV SOLN
INTRAVENOUS | Status: AC
  Filled 2024-05-15: qty 16

## 2024-05-15 MED ORDER — LIDOCAINE HCL (PF) 1 % IJ SOLN
INTRAMUSCULAR | Status: DC | PRN
  Administered 2024-05-15 (×2): 2 mL via INTRADERMAL

## 2024-05-15 MED ORDER — VERAPAMIL HCL 2.5 MG/ML IV SOLN
INTRAVENOUS | Status: DC | PRN
  Administered 2024-05-15 (×2): 10 mL via INTRA_ARTERIAL

## 2024-05-15 MED ORDER — ADENOSINE 6 MG/2ML IV SOLN
INTRAVENOUS | Status: DC | PRN
  Administered 2024-05-15 (×2): 48 mg via INTRAVENOUS

## 2024-05-15 SURGICAL SUPPLY — 28 items
BALL DC AGENT 2.50X20 (BALLOONS) IMPLANT
BALLOON EMERGE MR 2.5X12 (BALLOONS) IMPLANT
BALLOON EMERGE MR 3.0X12 (BALLOONS) IMPLANT
BALLOON SAPPHIRE 2.5X15 (BALLOONS) IMPLANT
BALLOON SAPPHIRE 3.0X15 (BALLOONS) IMPLANT
BALLOON SAPPHIRE NC24 3.25X12 (BALLOONS) IMPLANT
BALLOON SAPPHIRE NC24 3.25X18 (BALLOONS) IMPLANT
BALLOON SAPPHIRE NC24 3.5X12 (BALLOONS) IMPLANT
BALLOON SCOREFLEX 2.50X10 (BALLOONS) IMPLANT
BALLOON SCOREFLEX 3.0X15 (BALLOONS) IMPLANT
BALLOON ~~LOC~~ EMERGE MR 2.5X12 (BALLOONS) IMPLANT
CATH INFINITI 5 FR AR1 MOD (CATHETERS) IMPLANT
CATH INFINITI AMBI 5FR TG (CATHETERS) IMPLANT
CATH INFINITI JR4 5F (CATHETERS) IMPLANT
CATH LAUNCHER 6FR EBU3.5 (CATHETERS) IMPLANT
CATH OPTICROSS HD (CATHETERS) IMPLANT
DEVICE RAD COMP TR BAND LRG (VASCULAR PRODUCTS) IMPLANT
DRAPE IVUS SLED (BAG) IMPLANT
GLIDESHEATH SLEND SS 6F .021 (SHEATH) IMPLANT
GUIDEWIRE INQWIRE 1.5J.035X260 (WIRE) IMPLANT
GUIDEWIRE PRESSURE X 175 (WIRE) IMPLANT
KIT ENCORE 26 ADVANTAGE (KITS) IMPLANT
KIT HEMO VALVE WATCHDOG (MISCELLANEOUS) IMPLANT
PACK CARDIAC CATHETERIZATION (CUSTOM PROCEDURE TRAY) ×2 IMPLANT
SET ATX-X65L (MISCELLANEOUS) IMPLANT
STENT SYNERGY XD 3.0X16 (Permanent Stent) IMPLANT
STENT SYNERGY XD 3.0X20 (Permanent Stent) IMPLANT
WIRE ASAHI PROWATER 180CM (WIRE) IMPLANT

## 2024-05-15 NOTE — Plan of Care (Signed)
  Problem: Education: Goal: Understanding of CV disease, CV risk reduction, and recovery process will improve Outcome: Progressing Goal: Individualized Educational Video(s) Outcome: Progressing   Problem: Activity: Goal: Ability to return to baseline activity level will improve Outcome: Progressing   Problem: Cardiovascular: Goal: Ability to achieve and maintain adequate cardiovascular perfusion will improve Outcome: Progressing Goal: Vascular access site(s) Level 0-1 will be maintained Outcome: Progressing   Problem: Health Behavior/Discharge Planning: Goal: Ability to safely manage health-related needs after discharge will improve Outcome: Progressing   Problem: Education: Goal: Knowledge of General Education information will improve Description: Including pain rating scale, medication(s)/side effects and non-pharmacologic comfort measures Outcome: Progressing   Problem: Health Behavior/Discharge Planning: Goal: Ability to manage health-related needs will improve Outcome: Progressing   Problem: Clinical Measurements: Goal: Ability to maintain clinical measurements within normal limits will improve Outcome: Progressing Goal: Will remain free from infection Outcome: Progressing Goal: Diagnostic test results will improve Outcome: Progressing Goal: Respiratory complications will improve Outcome: Progressing Goal: Cardiovascular complication will be avoided Outcome: Progressing   Problem: Activity: Goal: Risk for activity intolerance will decrease Outcome: Progressing   Problem: Nutrition: Goal: Adequate nutrition will be maintained Outcome: Progressing   Problem: Coping: Goal: Level of anxiety will decrease Outcome: Progressing   Problem: Pain Managment: Goal: General experience of comfort will improve and/or be controlled Outcome: Progressing   Problem: Safety: Goal: Ability to remain free from injury will improve Outcome: Progressing   Problem: Skin  Integrity: Goal: Risk for impaired skin integrity will decrease Outcome: Progressing

## 2024-05-15 NOTE — Progress Notes (Addendum)
 Report called to nurse for [phone] and transferred to room via stretcher with RN and cardiac monitor

## 2024-05-15 NOTE — Progress Notes (Signed)
 CARDIAC REHAB PHASE I     Post stent education including site care, restrictions, risk factors, exercise guidelines, NTG use, antiplatelet therapy importance, heart healthy  diet and CRP2 reviewed. All questions and concerns addressed. Will refer to [facility] for CRP2. Plan for overnight observation. CRP1 education and referral completed.    [id number] Vaughn Asberry Hacking, RN BSN 05/15/2024 2:15 PM

## 2024-05-15 NOTE — Research (Addendum)
 Prevail Informed Consent   Subject Name: Mark Garner  Subject met inclusion and exclusion criteria.  The informed consent form, study requirements and expectations were reviewed with the subject and questions and concerns were addressed prior to the signing of the consent form.  The subject verbalized understanding of the trial requirements.  The subject agreed to participate in the Prevail trial and signed the informed consent at 0845 on 05-15-2024.  The informed consent was obtained prior to performance of any protocol-specific procedures for the subject.  A copy of the signed informed consent was given to the subject and a copy was placed in the subject's medical record.   [name]   Screen fail

## 2024-05-15 NOTE — Progress Notes (Signed)
 Client c/o I do not feed good  color pale; no c/o shortness of breath or pain; Dr Elmira notified and in to see client; client to be admitted

## 2024-05-15 NOTE — Interval H&P Note (Signed)
 History and Physical Interval Note:  05/15/2024 8:41 AM  Mark [facility]ner S[facility]  has presented today for surgery, with the diagnosis of cad - angina.  The various methods of treatment have been discussed with the patient and family. After consideration of risks, benefits and other options for treatment, the patient has consented to  Procedure(s): LEFT HEART CATH AND CORONARY ANGIOGRAPHY (N/A) as a surgical intervention.  The patient's history has been reviewed, patient examined, no change in status, stable for surgery.  I have reviewed the patient's chart and labs.  Questions were answered to the patient's satisfaction.     Cully Luckow J Mace Weinberg

## 2024-05-15 NOTE — H&P (Addendum)
 OV 05/09/2024 copied for documentation   Cardiology Office Note:  .   Date:  05/15/2024  ID:  Mark Garner, DOB Apr 19, 1946, MRN [id number] PCP: Mark Slater, MD  [facility] Providers Cardiologist:  Newman Lawrence, MD PCP: Mark Slater, MD  C/c: CAD   Mark Garner is a 78 y.o. male with mixed hyperlipidemia, coronary artery disease, paroxysmal atrial fibrillation  History of Present Illness  Patient previously saw Dr. Alveta, last seen in 12/2023.  Recently, patient's physical activity has gone down as he is caring for his wife recovering from hip surgery.  In the past, he used to walk several miles and bike regularly.  However, recently, he has noticed episodes of chest pressure with much less intensity physical activity such as walking from bathroom to his bed.  This is unusual for the patient and is hoping [place] and walk couple miles a day.  Reviewed prior cardiac catheterization images most recently from 2021 when he had NSTEMI and PCI to OM.  There is residual disease in RCA.  On a separate note, his A-fib episodes are fairly infrequent occured only few times in last couple years.      There were no vitals filed for this visit.     Review of Systems  Cardiovascular:  Positive for chest pain. Negative for dyspnea on exertion, leg swelling, palpitations and syncope.        Studies Reviewed: SABRA    EKG 05/09/2024: Sinus rhythm 56 bpm Right bundle branch block Left anterior fascicular block Bifascicular block Septal infarct (cited on or before 11-May-2022) When compared with ECG of 13-Dec-2023 11:03, No significant change was found  Long-term monitor 08/2023:   Predominant rhythm is sinus rhythm.   70 episodes of nonsustained supraventricular tachycardia.  the fasterest interval lasted 5 beats with a max HR of 218.  The longest episode lasted 17 seconds with Avg HR of 100   Rare PACs, Rare PVCs    Stress test 08/2023:   The  study is normal. The study is low risk.   Fair exercise capacity, achieved 7.0 METS   Peak heart rate 151 bpm, 106% max age-predicted heart rate   Hypertensive response to exercise, peak BP 209/99   horizontal ST depression (V3) was noted.   LV perfusion is normal. There is no evidence of ischemia. There is no evidence of infarction.   Left ventricular function is normal. Nuclear stress EF: 61%. The left ventricular ejection fraction is normal (55-65%). End diastolic cavity size is normal. End systolic cavity size is normal.   Prior study available for comparison from 05/11/2022.   Fixed inferior perfusion defect with normal wall motion, consistent with artifact LVEF 61% Low risk study  Echocardiogram 2023:  1. Left ventricular ejection fraction, by estimation, is 60 to 65%. The  left ventricle has normal function. The left ventricle has no regional  wall motion abnormalities. Left ventricular diastolic parameters are  consistent with Grade I diastolic  dysfunction (impaired relaxation).   2. Right ventricular systolic function is normal. The right ventricular  size is normal.   3. The mitral valve is normal in structure. Trivial mitral valve  regurgitation. No evidence of mitral stenosis.   4. The aortic valve is calcified. Aortic valve regurgitation is not  visualized. Aortic valve sclerosis/calcification is present, without any  evidence of aortic stenosis.   5. The inferior vena cava is normal in size with greater than 50%  respiratory variability, suggesting right  atrial pressure of 3 mmHg.   Coronary angiogram 2020: 1. Multivessel CAD in a diffuse, small vessel disease pattern 2. Severe mid-circumflex stenosis treated successfully with PTCA and DES implantation (2.25x22 mm Resolute Onyx) 3. Severe mid and distal LAD stenoses successfully treated with PTCA and stenting using a 2.5x22 mm Resolute Onyx and 2.0x15 mm Resolute Onyx, respectively 4. Diffuse nonobstructive RCA stenosis  with severe stenosis of the ostial right PDA not favorable for PCI in small vessel with ostial lesion location 5. Moderate residual disease in the ramus intermedius   Recommend: 2D Echo for LVEF assessment, ASA/ticagrelor  x 12 months minimum, med Rx for residual CAD, aggressive lipid lowering as tolerated    Labs 06/2023-10/2023: Chol 205, TG 206, HDL 35, LDL 133 HbA1C 6.1% Hb 13.2 Cr 0.8 TSH 2.7  Risk Assessment/Calculations:    CHA2DS2-VASc Score = 4   This indicates a 4.8% annual risk of stroke. The patient's score is based upon: CHF History: 0 HTN History: 1 Diabetes History: 0 Stroke History: 0 Vascular Disease History: 1 Age Score: 2 Gender Score: 0     Physical Exam Vitals and nursing note reviewed.  Constitutional:      General: He is not in acute distress. Neck:     Vascular: No JVD.  Cardiovascular:     Rate and Rhythm: Normal rate and regular rhythm.     Heart sounds: Normal heart sounds. No murmur heard. Pulmonary:     Effort: Pulmonary effort is normal.     Breath sounds: Normal breath sounds. No wheezing or rales.  Musculoskeletal:     Right lower leg: No edema.     Left lower leg: No edema.      VISIT DIAGNOSES: No diagnosis found.    Mark Garner is a 78 y.o. male with mixed hyperlipidemia, coronary artery disease, paroxysmal atrial fibrillation Assessment & Plan  CAD: Recent episodes of angina with much less intensity of physical activity than what is usual for the patient, thus fits the definition for unstable or accelerated angina. High pretest probability for obstructive CAD. Recommend coronary angiography with possible intervention. Continue Plavix  75 mg daily. Increase metoprolol  to tartrate to 25 mg twice daily, continue Imdur  30 mg daily. Continue Repatha , there has been some noncompliance in the recent past. Check CBC, BMP, lipid panel. If he undergoes PCI, I will likely add aspirin  for 1 week, and then revert back to Plavix  and  Eliquis  without Aspirin .  PAF: Continue Eliquis  5 mg twice daily. Increase metoprolol  to tartrate to 25 mg twice daily for anginal symptoms.  Carotid stenosis: Mild in 2024.  Repeat carotid plaques in 6 months. Continue medical management as above.  Informed Consent   Shared Decision Making/Informed Consent The risks [stroke (1 in 1000), death (1 in 1000), kidney failure [usually temporary] (1 in 500), bleeding (1 in 200), allergic reaction [possibly serious] (1 in 200)], benefits (diagnostic support and management of coronary artery disease) and alternatives of a cardiac catheterization were discussed in detail with Mr. Baskett and he is willing to proceed.       No orders of the defined types were placed in this encounter.    F/u after cath F/u w/me in 6 months  Signed, Newman JINNY Lawrence, MD

## 2024-05-16 ENCOUNTER — Encounter (HOSPITAL_COMMUNITY): Payer: Self-pay | Admitting: Cardiology

## 2024-05-16 ENCOUNTER — Other Ambulatory Visit (HOSPITAL_COMMUNITY): Payer: Self-pay

## 2024-05-16 DIAGNOSIS — I252 Old myocardial infarction: Secondary | ICD-10-CM | POA: Diagnosis not present

## 2024-05-16 DIAGNOSIS — Z79899 Other long term (current) drug therapy: Secondary | ICD-10-CM | POA: Diagnosis not present

## 2024-05-16 DIAGNOSIS — T82855A Stenosis of coronary artery stent, initial encounter: Secondary | ICD-10-CM | POA: Diagnosis not present

## 2024-05-16 DIAGNOSIS — I480 Paroxysmal atrial fibrillation: Secondary | ICD-10-CM | POA: Diagnosis not present

## 2024-05-16 DIAGNOSIS — I10 Essential (primary) hypertension: Secondary | ICD-10-CM | POA: Diagnosis not present

## 2024-05-16 DIAGNOSIS — Z7901 Long term (current) use of anticoagulants: Secondary | ICD-10-CM | POA: Diagnosis not present

## 2024-05-16 DIAGNOSIS — D72829 Elevated white blood cell count, unspecified: Secondary | ICD-10-CM | POA: Diagnosis not present

## 2024-05-16 DIAGNOSIS — E782 Mixed hyperlipidemia: Secondary | ICD-10-CM | POA: Diagnosis not present

## 2024-05-16 DIAGNOSIS — I6529 Occlusion and stenosis of unspecified carotid artery: Secondary | ICD-10-CM | POA: Diagnosis not present

## 2024-05-16 DIAGNOSIS — Z7902 Long term (current) use of antithrombotics/antiplatelets: Secondary | ICD-10-CM | POA: Diagnosis not present

## 2024-05-16 DIAGNOSIS — I25110 Atherosclerotic heart disease of native coronary artery with unstable angina pectoris: Secondary | ICD-10-CM | POA: Diagnosis not present

## 2024-05-16 LAB — CBC
HCT: 36.1 % — ABNORMAL LOW (ref 39.0–52.0)
Hemoglobin: 12.6 g/dL — ABNORMAL LOW (ref 13.0–17.0)
MCH: 31.1 pg (ref 26.0–34.0)
MCHC: 34.9 g/dL (ref 30.0–36.0)
MCV: 89.1 fL (ref 80.0–100.0)
Platelets: 354 10*3/uL (ref 150–400)
RBC: 4.05 MIL/uL — ABNORMAL LOW (ref 4.22–5.81)
RDW: 12.4 % (ref 11.5–15.5)
WBC: 12.5 10*3/uL — ABNORMAL HIGH (ref 4.0–10.5)
nRBC: 0 % (ref 0.0–0.2)

## 2024-05-16 LAB — BASIC METABOLIC PANEL WITH GFR
Anion gap: 10 (ref 5–15)
BUN: 12 mg/dL (ref 8–23)
CO2: 22 mmol/L (ref 22–32)
Calcium: 8.9 mg/dL (ref 8.9–10.3)
Chloride: 106 mmol/L (ref 98–111)
Creatinine, Ser: 0.94 mg/dL (ref 0.61–1.24)
GFR, Estimated: >60 mL/min (ref >60)
Glucose, Bld: 105 mg/dL — ABNORMAL HIGH (ref 70–99)
Potassium: 4.3 mmol/L (ref 3.5–5.1)
Sodium: 138 mmol/L (ref 135–145)

## 2024-05-16 MED ORDER — METOPROLOL TARTRATE 25 MG PO TABS
25.0000 mg | ORAL_TABLET | Freq: Two times a day (BID) | ORAL | Status: DC
  Administered 2024-05-16 (×2): 25 mg via ORAL
  Filled 2024-05-16: qty 1

## 2024-05-16 MED ORDER — APIXABAN 5 MG PO TABS
5.0000 mg | ORAL_TABLET | Freq: Two times a day (BID) | ORAL | Status: DC
  Administered 2024-05-16 (×2): 5 mg via ORAL
  Filled 2024-05-16: qty 1

## 2024-05-16 MED ORDER — LOSARTAN POTASSIUM 50 MG PO TABS
50.0000 mg | ORAL_TABLET | Freq: Every day | ORAL | Status: DC
  Administered 2024-05-16 (×2): 50 mg via ORAL
  Filled 2024-05-16: qty 1

## 2024-05-16 MED ORDER — CLOPIDOGREL BISULFATE 75 MG PO TABS
75.0000 mg | ORAL_TABLET | Freq: Every day | ORAL | Status: DC
  Administered 2024-05-16 (×2): 75 mg via ORAL
  Filled 2024-05-16: qty 1

## 2024-05-16 MED ORDER — ASPIRIN 81 MG PO TBEC
81.0000 mg | DELAYED_RELEASE_TABLET | Freq: Every day | ORAL | Status: DC
  Administered 2024-05-16 (×2): 81 mg via ORAL
  Filled 2024-05-16: qty 1

## 2024-05-16 MED ORDER — PANTOPRAZOLE SODIUM 40 MG PO TBEC
40.0000 mg | DELAYED_RELEASE_TABLET | Freq: Every day | ORAL | 2 refills | Status: AC
  Filled 2024-05-16: qty 30, 30d supply, fill #0

## 2024-05-16 MED ORDER — ASPIRIN 81 MG PO TBEC: 81.0000 mg | DELAYED_RELEASE_TABLET | Freq: Every day | ORAL | Status: AC

## 2024-05-16 NOTE — TOC CM/SW Note (Signed)
 Transition of Care [facility]) - Inpatient Brief Assessment   Patient Details  Name: Mark Garner MRN: [id number] Date of Birth: 12/16/1945  Transition of Care [facility]) CM/SW Contact:    Sudie Erminio Deems, RN Phone Number: 05/16/2024, 10:47 AM   Clinical Narrative: Patient presented for LHC. PTA patient was independent from home with spouse. Patient has DME in the home if needed. Patient has PCP and has transportation to appointments. No home needs identified at this time.    Transition of Care Asessment: Insurance and Status: Insurance coverage has been reviewed Patient has primary care physician: Yes Home environment has been reviewed: reviewed Prior level of function:: independent Prior/Current Home Services: No current home services Social Drivers of Health Review: SDOH reviewed no interventions necessary Readmission risk has been reviewed: Yes Transition of care needs: no transition of care needs at this time

## 2024-05-16 NOTE — Plan of Care (Signed)

## 2024-05-16 NOTE — Discharge Summary (Addendum)
 Discharge Summary   Patient ID: Mark Garner MRN: [id number]; DOB: 06/20/1946  Admit date: 05/15/2024 Discharge date: 05/16/2024  PCP:  Regino Slater, MD   [facility] Providers Cardiologist:  Newman JINNY Lawrence, MD       Discharge Diagnoses  Principal Problem:   CAD (coronary artery disease) Active Problems:   Hyperlipidemia   PAF (paroxysmal atrial fibrillation) (HCC)   Diagnostic Studies/Procedures  Coronary angiography & intervention 05/15/2024: LM: No significant disease LAD: Prox 30% disease          Mid and distal LAD stents          Mid LAD stent with 70% ISR, FFR 0.78 (physiologically significant) Lcx: Prox 80% stenosis         Mid Lcx stent with no significant restenosis         50% distal Lcx disease Ramus: Prox 90% stenosis RCA: large, dominant vessel          Prox-mid 40%, distal 50% disease   LVEDP 13 mmHg   Successful percutaneous coronary intervention         PTCA and stent placement 3.0 X 20 mm Synergy drug-eluting stent prox LCx        PTCA and stent placement 3.0 X 16 mm Synergy drug-eluting stent prox Ramus        PTCA and PTCA 2.5 X 20 mm Agent DCB mid LAD          Unusually delayed procedure time due to multivessel PCI, use of multiple balloons as described, and two-vessel stents and one-vessel drug-coated balloon angioplasty.   We will keep the patient overnight for observation given multivessel PCI, 210 cc contrast use requiring IV hydration. _____________   History of Present Illness   Mark Garner is a 78 y.o. male with mixed hyperlipidemia, CAD s/p DES to OM in 2021, and paroxysmal atrial fibrillation who presented on 8/6 for an outpatient visit where he reported worsening anginal symptoms. Given concern for progression of CAD, he was then scheduled for a cardiac catheterization for further evaluation.   Hospital Course    CAD LHC showed 70% in-stent restenosis of the mid LAD stent with FFR 0.78, proximal Lcx 80%  stenosis, and proximal ramus 90% stenosis. Patient underwent DES to Lcx and Ramus with angioplasty to the in-stent restenosis of the LAD.  Patient was kept overnight for IV hydration as he received 210 cc of contrast 2/2 the multiple interventions. Cr 0.94 on day of discharge.  Continue triple therapy with ASA 81 mg daily , plavix  75 mg daily , and eliquis  5 mg BID. Will discontinue ASA after two weeks. Start protonix  for GI protection Continue metoprolol  tartrate 25 mg BID Stop imdur  30 mg due to revascularization Continue SL nitroglycerin  0.4 mg as needed for chest pain with instruction on proper use  Mixed Hyperlipidemia Has a history of intolerance to niacin, statins, zetia , fenofibrate, and gemfibrozil Lipoprotein (a) pending at discharge LDL  54   HDL 38    TG 223 Continue Repatha   Paroxysmal Atrial Fibrillation  Per telemetry, patient is in NSR with PVCs Continue metoprolol  tartrate 25 mg BID Continue eliquis  5 mg BID  Hypertension BP: 141/70 Continue cozaar  50 mg Continue metoprolol  tartrate 25 mg BID  Leukocytosis Most likely reactive to procedure. Patient denies systemic infectious symptoms  General: Well developed, well nourished, male appearing in no acute distress. Head: Normocephalic, atraumatic.  Neck: Supple without bruits Lungs:  Resp regular and unlabored, CTA. Heart: RRR, S1, S2,  no S3, S4, 2/6 blowing mumur. Abdomen: Soft, non-tender, non-distended  Extremities: No clubbing, cyanosis, edema.  R radial cath site stable without bruising or hematoma. Radial pulse 2+. Dressing clean and dry. Neuro: Alert and oriented X 3. Moves all extremities spontaneously. Psych: Normal affect.  Patient examined by myself and by Dr. Anner. They were deemed ready for discharge from a cardiac perspective. Medication education provided by pharmacy. Follow up appointment is arranged.         Did the patient have an acute coronary syndrome (MI, NSTEMI, STEMI, etc) this  admission?:  No                               Did the patient have a percutaneous coronary intervention (stent / angioplasty)?:  Yes.     Cath/PCI Registry Performance & Quality Measures: Aspirin  prescribed? - Yes ADP Receptor Inhibitor (Plavix /Clopidogrel , Brilinta /Ticagrelor  or Effient/Prasugrel) prescribed (includes medically managed patients)? - Yes High Intensity Statin (Lipitor 40-80mg  or Crestor 20-40mg ) prescribed? - No - On Repatha  For EF <40%, was ACEI/ARB prescribed? - Not Applicable (EF >/= 40%) For EF <40%, Aldosterone Antagonist (Spironolactone or Eplerenone) prescribed? - Not Applicable (EF >/= 40%) Cardiac Rehab Phase II ordered? - Yes          _____________  Discharge Vitals Blood pressure (!) 141/70, pulse 72, temperature 97.9 F (36.6 C), temperature source Oral, resp. rate 18, height 5' 9 (1.753 m), weight 72.8 kg, SpO2 96%.  Filed Weights   05/15/24 0855 05/15/24 1646  Weight: 72.6 kg 72.8 kg    Labs & Radiologic Studies  CBC Recent Labs    05/16/24 0415  WBC 12.5*  HGB 12.6*  HCT 36.1*  MCV 89.1  PLT 354   Basic Metabolic Panel Recent Labs    91/86/74 0415  NA 138  K 4.3  CL 106  CO2 22  GLUCOSE 105*  BUN 12  CREATININE 0.94  CALCIUM 8.9   Liver Function Tests No results for input(s): AST, ALT, ALKPHOS, BILITOT, PROT, ALBUMIN in the last 72 hours. No results for input(s): LIPASE, AMYLASE in the last 72 hours. High Sensitivity Troponin:   No results for input(s): TROPONINIHS in the last 720 hours.  No results for input(s): TRNPT in the last 720 hours.  BNP Invalid input(s): POCBNP No results for input(s): PROBNP in the last 72 hours.  No results for input(s): BNP in the last 72 hours.  D-Dimer No results for input(s): DDIMER in the last 72 hours. Hemoglobin A1C No results for input(s): HGBA1C in the last 72 hours. Fasting Lipid Panel No results for input(s): CHOL, HDL, LDLCALC, TRIG,  CHOLHDL, LDLDIRECT in the last 72 hours. No results found for: LIPOA  Thyroid  Function Tests No results for input(s): TSH, T4TOTAL, T3FREE, THYROIDAB in the last 72 hours.  Invalid input(s): FREET3 _____________  CARDIAC CATHETERIZATION Result Date: 05/15/2024 Images from the original result were not included. Coronary angiography & intervention 05/15/2024: LM: No significant disease LAD: Prox 30% disease          Mid and distal LAD stents          Mid LAD stent with 70% ISR, FFR 0.78 (physiologically significant) Lcx: Prox 80% stenosis         Mid Lcx stent with no significant restenosis         50% distal Lcx disease Ramus: Prox 90% stenosis RCA: large, dominant vessel  Prox-mid 40%, distal 50% disease LVEDP 13 mmHg Successful percutaneous coronary intervention        PTCA and stent placement 3.0 X 20 mm Synergy drug-eluting stent prox LCx        PTCA and stent placement 3.0 X 16 mm Synergy drug-eluting stent prox Ramus        PTCA and PTCA 2.5 X 20 mm Agent DCB mid LAD Unusually delayed procedure time due to multivessel PCI, use of multiple balloons as described, and two-vessel stents and one-vessel drug-coated balloon angioplasty. We will keep the patient overnight for observation given multivessel PCI, 210 cc contrast use requiring IV hydration. Manish JINNY Lawrence, MD    Disposition Pt is being discharged home today in good condition.  Follow-up Plans & Appointments  Follow up appointment with Josefa Beauvais NP on 06/01/24  Discharge Instructions     Amb Referral to Cardiac Rehabilitation   Complete by: As directed    [place]   Diagnosis: Coronary Stents   After initial evaluation and assessments completed: Virtual Based Care may be provided alone or in conjunction with Phase 2 Cardiac Rehab based on patient barriers.: Yes   Intensive Cardiac Rehabilitation (ICR) [facility] location only OR Traditional Cardiac Rehabilitation (TCR) *If criteria for ICR are not met will  enroll in TCR [facility] only): Yes   Call MD for:  redness, tenderness, or signs of infection (pain, swelling, redness, odor or green/yellow discharge around incision site)   Complete by: As directed    Diet - low sodium heart healthy   Complete by: As directed    Discharge instructions   Complete by: As directed    Please start taking aspirin  for 2 weeks, then stop.  We changed your pepcid  to pantoprazole  to help protect your stomach since you will be taking 3 medications that could increase your risk of bleeding.   Radial Site Care Refer to this sheet in the next few weeks. These instructions provide you with information on caring for yourself after your procedure. Your caregiver may also give you more specific instructions. Your treatment has been planned according to current medical practices, but problems sometimes occur. Call your caregiver if you have any problems or questions after your procedure. HOME CARE INSTRUCTIONS You may shower the day after the procedure. Remove the bandage (dressing) and gently wash the site with plain soap and water . Gently pat the site dry.  Do not apply powder or lotion to the site.  Do not submerge the affected site in water  for 3 to 5 days.  Inspect the site at least twice daily.  Do not flex or bend the affected arm for 24 hours.  No lifting over 5 pounds (2.3 kg) for 5 days after your procedure.  Do not drive home if you are discharged the same day of the procedure. Have someone else drive you.  You may drive 24 hours after the procedure unless otherwise instructed by your caregiver.  What to expect: Any bruising will usually fade within 1 to 2 weeks.  Blood that collects in the tissue (hematoma) may be painful to the touch. It should usually decrease in size and tenderness within 1 to 2 weeks.  SEEK IMMEDIATE MEDICAL CARE IF: You have unusual pain at the radial site.  You have redness, warmth, swelling, or pain at the radial site.  You have drainage  (other than a small amount of blood on the dressing).  You have chills.  You have a fever or persistent symptoms for  more than 72 hours.  You have a fever and your symptoms suddenly get worse.  Your arm becomes pale, cool, tingly, or numb.  You have heavy bleeding from the site. Hold pressure on the site.   Increase activity slowly   Complete by: As directed    Remove dressing in 24 hours   Complete by: As directed        Discharge Medications Allergies as of 05/16/2024       Reactions   Other    Other reaction(s): Other (See Comments) Myalgias with multiple statins   Statins Other (See Comments)   Myalgias with multiple statins   Amoxicillin    Other reaction(s): GI   Fenofibrate Other (See Comments)   Leg pain   Gemfibrozil Other (See Comments)   Leg pain   Niacin And Related    Severe flushing        Medication List     STOP taking these medications    famotidine  20 MG tablet Commonly known as: PEPCID    isosorbide  mononitrate 30 MG 24 hr tablet Commonly known as: IMDUR        TAKE these medications    acetaminophen  500 MG tablet Commonly known as: TYLENOL  Take 500 mg by mouth every 6 (six) hours as needed (pain).   aspirin  EC 81 MG tablet Take 1 tablet (81 mg total) by mouth daily. Please take for 2 weeks then discontinue use   clopidogrel  75 MG tablet Commonly known as: PLAVIX  TAKE 1 TABLET(75 MG) BY MOUTH DAILY   clotrimazole-betamethasone cream Commonly known as: LOTRISONE Apply 1 Application topically daily as needed (irritation).   Eliquis  5 MG Tabs tablet Generic drug: apixaban  TAKE 1 TABLET(5 MG) BY MOUTH TWICE DAILY   GAS-X EXTRA STRENGTH PO Take 1 capsule by mouth 4 (four) times daily as needed (gas).   lamoTRIgine  100 MG tablet Commonly known as: LaMICtal  Take 0.5 tablets (50 mg total) by mouth 2 (two) times daily.   losartan  50 MG tablet Commonly known as: COZAAR  TAKE 1 TABLET(50 MG) BY MOUTH DAILY   metoprolol  tartrate 25  MG tablet Commonly known as: LOPRESSOR  Take 1 tablet (25 mg total) by mouth 2 (two) times daily.   nitroGLYCERIN  0.4 MG SL tablet Commonly known as: NITROSTAT  Place 1 tablet (0.4 mg total) under the tongue every 5 (five) minutes as needed for chest pain.   pantoprazole  40 MG tablet Commonly known as: Protonix  Take 1 tablet (40 mg total) by mouth daily.   Repatha  SureClick 140 MG/ML Soaj Generic drug: Evolocumab  ADMINISTER 140 MG UNDER THE SKIN EVERY 14 DAYS         Outstanding Labs/Studies Lipoprotein (a)  Duration of Discharge Encounter: APP Time: 25 minutes   Signed, Leontine LOISE Salen, PA-C 05/16/2024, 10:17 AM   ATTENDING ATTESTATION  I have seen, examined and evaluated the patient this morning on rounds along with Leontine Salen, PA.  After reviewing all the available data and chart, we discussed the patients laboratory, study & physical findings as well as symptoms in detail.  I agree with her findings, examination as well as impression recommendations as per our discussion.    Multivessel PCI yesterday.  Did well overnight.  No further symptoms.  Overall feels better.   Stable for discharge.     Alm MICAEL Clay, MD, MS Alm Clay, M.D., M.S. Interventional Chartered certified accountant  Pager # [phone]

## 2024-05-18 ENCOUNTER — Telehealth: Payer: Self-pay | Admitting: Physician Assistant

## 2024-05-18 LAB — LIPOPROTEIN A (LPA): Lipoprotein (a): 24.2 nmol/L (ref <75.0)

## 2024-05-18 NOTE — Telephone Encounter (Signed)
 Patient contacted the afterhour answering service. He just left the hospital 2 days ago after PCI. He has been on Eliquis  for h/o afib. EKG at the time of discharge was in sinus rhythm. He felt dizziness started 3 hours ago, this is his afib symptom, HR has been ranging 80-140s. SBP 115-147. He took his metoprolol  late today at 1PM, I asked him to take the night time dose of metoprolol  now.   ED precaution given, go to ED if: Severely symptomatic with severe dizziness or feeling of passing out HR persistently above 120 bpm despite rate control med  If he ends up in the ED, he would be a good candidate for ED cardioversion   Call our office Monday morning to see if can get a early follow up with Afib clinic and set up outpatient cardioversion.  Bonney Scot Ford PA Pager: [phone]

## 2024-05-31 NOTE — Progress Notes (Signed)
 Cardiology Office Note:    Date:  06/01/2024   ID:  Mark Garner, DOB 07/03/1946, MRN [id number]  PCP:  Regino Slater, MD   [facility] Providers Cardiologist:  Newman JINNY Lawrence, MD     Referring MD: Regino Slater, MD   Chief complaint: Follow-up cardiac catheterization with intervention.  History of Present Illness:    Mark Garner is a 78 y.o. male with a hx of coronary artery disease, paroxysmal atrial fibrillation, mixed hyperlipidemia, bilateral carotid stenosis here for follow-up post LHC, 3.0X 20 mm Synergy DES proximal LCfx, 3.0X 16 mm Synergy DES proximal ramus, PTCA 2.5X 20 mm DCB mid LAD.  2016: Followed by Dr. Alveta, Avid cycler, complaints of chest pain with negative, low risk stress test.  2020: Noticed chest pain with irregular heartbeat, negative Lexiscan  on May 7.  Cardiac cath by Dr. Wonda led to PCI of mid LCfx, mid and distal LAD.  Was observed to have paroxysmal atrial fibrillation while on telemetry.  He was placed on Eliquis , aspirin  and Brilinta  (Brilinta  changed to Plavix , aspirin  eventually discontinued).  Dr. Mona noted history of failing 3 different statin medications, intolerance to fenofibrate, gemfibrozil, niacin caused severe flushing.  Maintained on as a 10-day and OTC fish oil .  Right sided bruit observed by Dr. Alveta, duplex showed bilateral carotid disease requiring yearly monitoring.  2023: Mild dizziness and unable to take as deep a breath as he would like.  Stress test was low risk, no evidence of ischemia, demonstrated an incomplete RBBB at rest, developing into RBBB with stress.  Echo 05/23/2022 showed LVEF 60-65%, normal LV function, no RWMA, G1 DD, normal RV, trivial mitral regurg, aortic valve sclerosis without stenosis.  Patient stopped cycling and symptoms resolved.  2024: Complained of chest pain upper abdomen/lower chest, took 3 sublingual nitroglycerin  with resolution of pain after 10 minutes in March.  Observed some  minor heart rate irregularities according to his watch in October, ZIO monitor ordered. Zio monitor in October 2024 showed normal sinus rhythm, occasional episodes of SVT documented as not serious but possibly symptomatic.  Patient did not wish to increase metoprolol  at that time.  2025: Patient called the office describing intermittent chest pain over the month of July.  Was evaluated by Dr. Lawrence, scheduled for cardiac cath which was performed on 05/15/2024. 3.0X 20 mm Synergy DES proximal LCfx, 3.0X 16 mm Synergy DES proximal ramus, PTCA 2.5X 20 mm DCB mid LAD performed via radial artery (laterality unspecified).  ASA 81 mg X 2 weeks then DC, continue Plavix  and Eliquis  throughout.  Patient states there have been some changes to his health over the last couple weeks since his cath. The twinges in his chest he felt prior to the procedure feel less intense in nature, occurring less often, but overall still present, last a few seconds each, not exacerbated by eating/exercise, occur at random, not associated w/ an increase in HR. One episode did occur last week involving chest pressure and overall feeling bad. Patient took one nitroglycerin , no change in sensation after a few minutes, took the second, and finally had relief of chest pressure. Also states he's noticed an increase in the frequency of these episodes of irregular tachycardias. Only symptom of the tachycardias is hand numbness/tingling that resolves when the tachycardia stops, once he feels the hand paraesthesia's he checks his watch and notices the tachycardia.  Since the cath he's been notified by his fit-bit 3 separate times. Prior to the cath it only occurred, on  average, once per year. He reports increased fatigue, unsure if secondary to metoprolol  increase or cath. Offered changing to coreg, patient states he would wait and consider it at next visit if issue persists. Denies SOB, dizziness, lightheadedness, near syncope, nausea,  vomiting, bruising, dark/bloody/tarry stools, orthopnea, leg swelling.    Past Medical History:  Diagnosis Date   Chest pain    Chest tightness 04/06/2011   Dysesthesia 10/19/2016   Heart murmur    History of cervical spinal surgery 10/19/2016   History of lumbosacral spine surgery 10/19/2016   Hyperlipidemia    Indigestion    Non-ST elevation (NSTEMI) myocardial infarction (HCC) 03/15/2019   Normal echocardiogram 05/31/05   normal LV function; trace MR;trace tricuspid regurgitation   Normal nuclear stress test 07/26/02   no evidence of ischemia; nml LVF   Polyneuropathy 10/19/2016   Right bundle branch block    Trace mitral regurgitation by prior echocardiogram    Trace tricuspid regurgitation by prior echocardiogram    Unstable angina (HCC) 03/14/2019   Upper airway cough syndrome 09/12/2014   Followed in Pulmonary clinic/ [facility]/ [facility]  - gerd rx 09/12/2014 >>>     Past Surgical History:  Procedure Laterality Date   CORONARY PRESSURE/FFR WITH 3D MAPPING N/A 05/15/2024   Procedure: Coronary Pressure/FFR w/3D Mapping;  Surgeon: Elmira Newman PARAS, MD;  Location: [facility] INVASIVE CV LAB;  Service: Cardiovascular;  Laterality: N/A;   CORONARY STENT INTERVENTION N/A 03/14/2019   Procedure: CORONARY STENT INTERVENTION;  Surgeon: Wonda Sharper, MD;  Location: [facility] INVASIVE CV LAB;  Service: Cardiovascular;  Laterality: N/A;   CORONARY STENT INTERVENTION N/A 05/15/2024   Procedure: CORONARY STENT INTERVENTION;  Surgeon: Elmira Newman PARAS, MD;  Location: [facility] INVASIVE CV LAB;  Service: Cardiovascular;  Laterality: N/A;   CORONARY ULTRASOUND/IVUS N/A 05/15/2024   Procedure: Coronary Ultrasound/IVUS;  Surgeon: Elmira Newman PARAS, MD;  Location: [facility] INVASIVE CV LAB;  Service: Cardiovascular;  Laterality: N/A;   LEFT HEART CATH AND CORONARY ANGIOGRAPHY N/A 03/14/2019   Procedure: LEFT HEART CATH AND CORONARY ANGIOGRAPHY;  Surgeon: Wonda Sharper, MD;  Location: [facility] INVASIVE CV LAB;  Service:  Cardiovascular;  Laterality: N/A;   LEFT HEART CATH AND CORONARY ANGIOGRAPHY N/A 05/15/2024   Procedure: LEFT HEART CATH AND CORONARY ANGIOGRAPHY;  Surgeon: Elmira Newman PARAS, MD;  Location: [facility] INVASIVE CV LAB;  Service: Cardiovascular;  Laterality: N/A;   SPINE SURGERY     x 2     Current Medications: Current Meds  Medication Sig   icosapent  Ethyl (VASCEPA ) 1 g capsule Take 2 capsules (2 g total) by mouth 2 (two) times daily.     Allergies:   Other, Statins, Amoxicillin, Fenofibrate, Gemfibrozil, and Niacin and related   Social History   Socioeconomic History   Marital status: Married    Spouse name: Not on file   Number of children: Not on file   Years of education: Not on file   Highest education level: Not on file  Occupational History   Occupation: Retired  Tobacco Use   Smoking status: Former    Current packs/day: 0.00    Average packs/day: 1 pack/day for 5.0 years (5.0 ttl pk-yrs)    Types: Cigarettes    Start date: 10/05/1979    Quit date: 10/04/1984    Years since quitting: 39.6   Smokeless tobacco: Never  Substance and Sexual Activity   Alcohol use: No    Alcohol/week: 0.0 standard drinks of alcohol   Drug use: No   Sexual activity: Not on  file  Other Topics Concern   Not on file  Social History Narrative   Not on file   Social Drivers of Health   Financial Resource Strain: Not on file  Food Insecurity: No Food Insecurity (05/15/2024)   Hunger Vital Sign    Worried About Running Out of Food in the Last Year: Never true    Ran Out of Food in the Last Year: Never true  Transportation Needs: No Transportation Needs (05/15/2024)   PRAPARE - Administrator, Civil Service (Medical): No    Lack of Transportation (Non-Medical): No  Physical Activity: Not on file  Stress: Not on file  Social Connections: Moderately Integrated (05/15/2024)   Social Connection and Isolation Panel    Frequency of Communication with Friends and Family: More than three  times a week    Frequency of Social Gatherings with Friends and Family: More than three times a week    Attends Religious Services: More than 4 times per year    Active Member of [employer] or Organizations: No    Attends Banker Meetings: Never    Marital Status: Married     Family History: The patient's family history includes Heart disease (age of onset: 54) in his brother.  ROS:   Please see the history of present illness.    All other systems reviewed and are negative.  EKGs/Labs/Other Studies Reviewed:    The following studies were reviewed today:  EKG Interpretation Date/Time:  Friday June 01 2024 10:37:00 EDT Ventricular Rate:  67 PR Interval:  160 QRS Duration:  138 QT Interval:  442 QTC Calculation: 467 R Axis:   -76  Text Interpretation: Sinus rhythm with occasional Premature ventricular complexes Right bundle branch block Left anterior fascicular block Bifascicular block Septal infarct (cited on or before 01-Jun-2024) When compared with ECG of 16-May-2024 04:19, Premature ventricular complexes are now Present Confirmed by Lynnelle Mesmer [phone]) on 06/01/2024 10:44:44 AM    Recent Labs: 06/21/2023: ALT 13 05/16/2024: BUN 12; Creatinine, Ser 0.94; Hemoglobin 12.6; Platelets 354; Potassium 4.3; Sodium 138  Recent Lipid Panel    Component Value Date/Time   CHOL 128 05/09/2024 1640   TRIG 223 (H) 05/09/2024 1640   HDL 38 (L) 05/09/2024 1640   CHOLHDL 3.4 05/09/2024 1640   CHOLHDL 5.6 03/14/2019 0847   VLDL 43 (H) 03/14/2019 0847   LDLCALC 54 05/09/2024 1640   LDLDIRECT 80.7 06/14/2014 0856     Risk Assessment/Calculations:    CHA2DS2-VASc Score = 4   This indicates a 4.8% annual risk of stroke. The patient's score is based upon: CHF History: 0 HTN History: 1 Diabetes History: 0 Stroke History: 0 Vascular Disease History: 1 Age Score: 2 Gender Score: 0               Physical Exam:    VS:  BP 139/73   Pulse 66   Ht 5' 9 (1.753 m)    Wt 165 lb (74.8 kg)   SpO2 97%   BMI 24.37 kg/m     Wt Readings from Last 3 Encounters:  06/01/24 165 lb (74.8 kg)  05/15/24 160 lb 9.6 oz (72.8 kg)  05/09/24 162 lb (73.5 kg)     GEN:  Well nourished, well developed in no acute distress HEENT: Normal NECK: No carotid bruits CARDIAC: S1-S2 normal, RRR w/ occasional irregularies, systolic murmur 2+/6, no rubs, gallops RESPIRATORY:  Clear to auscultation without rales, wheezing or rhonchi  ABDOMEN: Soft, non-tender, non-distended MUSCULOSKELETAL:  No  edema; No deformity. Right radial site healed, no hematoma or ecchymosis, rt radial pulse 2+. DP/PT 2+ bilaterally SKIN: Warm and dry NEUROLOGIC:  Alert and oriented x 3 PSYCHIATRIC:  Normal affect   ASSESSMENT:    1. PAF (paroxysmal atrial fibrillation) (HCC)   2. Coronary artery disease involving native coronary artery of native heart, unspecified whether angina present   3. Murmur   4. Fatigue, unspecified type   5. Tachycardia    PLAN:    In order of problems listed above:  CAD s/p PCI w/ DES -Chest pain, twinges, mildly improved in intensity/frequency following cath, otherwise unchanged. One episode of chest pain/pressure reported requiring 2 nitroglycerins prior to resolve. Episodes not associated with increased HR  -Right radial site healing well, no ecchymosis or hematoma, neurovascularly intact, 2+ pulse -Reports strict compliance with all medications following the procedure. ASA stopped two days ago as instructed, still on eliquis  and plavix  - Discussed findings with Dr. Elmira, who wants him to follow up with him in 2 months following results of testing ordered today  -Continue Plavix  75 mg daily -Use nitroglycerin  0.4 mg sublingual every 5 minutes as needed chest pain, refilled prescription today   -Continue metoprolol  tartrate 25 mg twice daily  -Continue losartan  50 mg tablet daily  Paroxysmal atrial fibrillation  -Continue Eliquis  5 mg twice daily, as this  is the correct dose for his age, weight, creatinine  - Ordering 2 week zio monitor   Systolic murmur  - Systolic murmur auscultated on exam, heard loudest right of sternal border, 2nd IC space  - Combined with new fatigue following cath, will order new echo to evaluate valvular status   Hyperlipidemia  -Continue Repatha  injection every 2 weeks  -Add Vascepa  2 g twice daily for elevated triglycerides  -Repeat lipid and LFT's in 3 months  Fatigue  - Discussed the possibility of it being related to the increase in metoprolol   - Offered to switch to Coreg, patient declined, stating he may consider it at his follow up appointment in two months. - Echo ordered to evaluate EF/valvular status. - Hgb mildly decreased from baseline on previous blood work, will order new CBC to be drawn today   Follow up with Dr. Elmira in 2 months      Medication Adjustments/Labs and Tests Ordered: Current medicines are reviewed at length with the patient today.  Concerns regarding medicines are outlined above.  Orders Placed This Encounter  Procedures   Lipid panel   Hepatic function panel   CBC   LONG TERM MONITOR (3-14 DAYS)   EKG 12-Lead   ECHOCARDIOGRAM COMPLETE   Meds ordered this encounter  Medications   icosapent  Ethyl (VASCEPA ) 1 g capsule    Sig: Take 2 capsules (2 g total) by mouth 2 (two) times daily.    Dispense:  360 capsule    Refill:  3   nitroGLYCERIN  (NITROSTAT ) 0.4 MG SL tablet    Sig: Place 1 tablet (0.4 mg total) under the tongue every 5 (five) minutes as needed for chest pain.    Dispense:  25 tablet    Refill:  5    Patient Instructions  Medication Instructions:  Your physician has recommended you make the following change in your medication:  START VASCEPA  1 G - TAKE 2 TABLETS TWICE DAILY *If you need a refill on your cardiac medications before your next appointment, please call your pharmacy*  Lab Work: TODAY: CBC TO BE DONE IN 3 MONTHS: LIPIDS, LFTS  If you  have labs (blood work) drawn today and your tests are completely normal, you will receive your results only by: MyChart Message (if you have MyChart) OR A paper copy in the mail If you have any lab test that is abnormal or we need to change your treatment, we will call you to review the results.  Testing/Procedures: Your physician has requested that you have an echocardiogram. Echocardiography is a painless test that uses sound waves to create images of your heart. It provides your doctor with information about the size and shape of your heart and how well your heart's chambers and valves are working. This procedure takes approximately one hour. There are no restrictions for this procedure. Please do NOT wear cologne, perfume, aftershave, or lotions (deodorant is allowed). Please arrive 15 minutes prior to your appointment time.  Please note: We ask at that you not bring children with you during ultrasound (echo/ vascular) testing. Due to room size and safety concerns, children are not allowed in the ultrasound rooms during exams. Our front office staff cannot provide observation of children in our lobby area while testing is being conducted. An adult accompanying a patient to their appointment will only be allowed in the ultrasound room at the discretion of the ultrasound technician under special circumstances. We apologize for any inconvenience.   Follow-Up: At [facility], you and your health needs are our priority.  As part of our continuing mission to provide you with exceptional heart care, our providers are all part of one team.  This team includes your primary Cardiologist (physician) and Advanced Practice Providers or APPs (Physician Assistants and Nurse Practitioners) who all work together to provide you with the care you need, when you need it.  ZIO XT- Long Term Monitor Instructions  Your physician has requested you wear a ZIO patch monitor for 14 days.  This is a single patch  monitor. [employer] supplies one patch monitor per enrollment. Additional stickers are not available. Please do not apply patch if you will be having a Nuclear Stress Test,  Echocardiogram, Cardiac CT, MRI, or Chest Xray during the period you would be wearing the  monitor. The patch cannot be worn during these tests. You cannot remove and re-apply the  ZIO XT patch monitor.  Your ZIO patch monitor will be mailed 3 day USPS to your address on file. It may take 3-5 days  to receive your monitor after you have been enrolled.  Once you have received your monitor, please review the enclosed instructions. Your monitor  has already been registered assigning a specific monitor serial # to you.  Billing and Patient Assistance Program Information  We have supplied [employer] with any of your insurance information on file for billing purposes. [employer] offers a sliding scale Patient Assistance Program for patients that do not have  insurance, or whose insurance does not completely cover the cost of the ZIO monitor.  You must apply for the Patient Assistance Program to qualify for this discounted rate.  To apply, please call [employer] at [phone], select option 4, select option 2, ask to apply for  Patient Assistance Program. Meredeth will ask your household income, and how many people  are in your household. They will quote your out-of-pocket cost based on that information.  [employer] will also be able to set up a 71-month, interest-free payment plan if needed.  Applying the monitor   Shave hair from upper left chest.  Hold abrader disc by orange tab. Rub abrader in 40 strokes  over the upper left chest as  indicated in your monitor instructions.  Clean area with 4 enclosed alcohol pads. Let dry.  Apply patch as indicated in monitor instructions. Patch will be placed under collarbone on left  side of chest with arrow pointing upward.  Rub patch adhesive wings for 2 minutes. Remove white label marked 1.  Remove the white  label marked 2. Rub patch adhesive wings for 2 additional minutes.  While looking in a mirror, press and release button in center of patch. A small green light will  flash 3-4 times. This will be your only indicator that the monitor has been turned on.  Do not shower for the first 24 hours. You may shower after the first 24 hours.  Press the button if you feel a symptom. You will hear a small click. Record Date, Time and  Symptom in the Patient Logbook.  When you are ready to remove the patch, follow instructions on the last 2 pages of Patient  Logbook. Stick patch monitor onto the last page of Patient Logbook.  Place Patient Logbook in the blue and white box. Use locking tab on box and tape box closed  securely. The blue and white box has prepaid postage on it. Please place it in the mailbox as  soon as possible. Your physician should have your test results approximately 7 days after the  monitor has been mailed back to [facility].  Call [facility] Customer Care at [phone] if you have questions regarding  your ZIO XT patch monitor. Call them immediately if you see an orange light blinking on your  monitor.  If your monitor falls off in less than 4 days, contact our Monitor department at [phone].  If your monitor becomes loose or falls off after 4 days call [employer] at [phone] for  suggestions on securing your monitor  Your next appointment:   2 month(s)  Provider:   Newman JINNY Lawrence, MD   We recommend signing up for the patient portal called MyChart.  Sign up information is provided on this After Visit Summary.  MyChart is used to connect with patients for Virtual Visits (Telemedicine).  Patients are able to view lab/test results, encounter notes, upcoming appointments, etc.  Non-urgent messages can be sent to your provider as well.   To learn more about what you can do with MyChart, go to [email or web address].           Signed, Miriam FORBES Shams, NP  06/01/2024 1:05 PM    [facility]

## 2024-06-01 ENCOUNTER — Encounter: Payer: Self-pay | Admitting: General Practice

## 2024-06-01 ENCOUNTER — Ambulatory Visit: Attending: General Practice | Admitting: Emergency Medicine

## 2024-06-01 ENCOUNTER — Ambulatory Visit: Attending: Emergency Medicine

## 2024-06-01 VITALS — BP 139/73 | HR 66 | Ht 69.0 in | Wt 165.0 lb

## 2024-06-01 DIAGNOSIS — R5383 Other fatigue: Secondary | ICD-10-CM

## 2024-06-01 DIAGNOSIS — E785 Hyperlipidemia, unspecified: Secondary | ICD-10-CM | POA: Diagnosis not present

## 2024-06-01 DIAGNOSIS — R011 Cardiac murmur, unspecified: Secondary | ICD-10-CM | POA: Diagnosis not present

## 2024-06-01 DIAGNOSIS — I480 Paroxysmal atrial fibrillation: Secondary | ICD-10-CM

## 2024-06-01 DIAGNOSIS — I2510 Atherosclerotic heart disease of native coronary artery without angina pectoris: Secondary | ICD-10-CM | POA: Diagnosis not present

## 2024-06-01 DIAGNOSIS — R000 Tachycardia, unspecified: Secondary | ICD-10-CM

## 2024-06-01 MED ORDER — ICOSAPENT ETHYL 1 G PO CAPS: 2.0000 g | ORAL_CAPSULE | Freq: Two times a day (BID) | ORAL | 3 refills | Status: AC

## 2024-06-01 MED ORDER — NITROGLYCERIN 0.4 MG SL SUBL: 0.4000 mg | SUBLINGUAL_TABLET | SUBLINGUAL | 5 refills | Status: AC | PRN

## 2024-06-01 NOTE — Patient Instructions (Signed)
 Medication Instructions:  Your physician has recommended you make the following change in your medication:  START VASCEPA  1 G - TAKE 2 TABLETS TWICE DAILY *If you need a refill on your cardiac medications before your next appointment, please call your pharmacy*  Lab Work: TODAY: CBC TO BE DONE IN 3 MONTHS: LIPIDS, LFTS  If you have labs (blood work) drawn today and your tests are completely normal, you will receive your results only by: MyChart Message (if you have MyChart) OR A paper copy in the mail If you have any lab test that is abnormal or we need to change your treatment, we will call you to review the results.  Testing/Procedures: Your physician has requested that you have an echocardiogram. Echocardiography is a painless test that uses sound waves to create images of your heart. It provides your doctor with information about the size and shape of your heart and how well your heart's chambers and valves are working. This procedure takes approximately one hour. There are no restrictions for this procedure. Please do NOT wear cologne, perfume, aftershave, or lotions (deodorant is allowed). Please arrive 15 minutes prior to your appointment time.  Please note: We ask at that you not bring children with you during ultrasound (echo/ vascular) testing. Due to room size and safety concerns, children are not allowed in the ultrasound rooms during exams. Our front office staff cannot provide observation of children in our lobby area while testing is being conducted. An adult accompanying a patient to their appointment will only be allowed in the ultrasound room at the discretion of the ultrasound technician under special circumstances. We apologize for any inconvenience.   Follow-Up: At [facility], you and your health needs are our priority.  As part of our continuing mission to provide you with exceptional heart care, our providers are all part of one team.  This team includes your  primary Cardiologist (physician) and Advanced Practice Providers or APPs (Physician Assistants and Nurse Practitioners) who all work together to provide you with the care you need, when you need it.  ZIO XT- Long Term Monitor Instructions  Your physician has requested you wear a ZIO patch monitor for 14 days.  This is a single patch monitor. [employer] supplies one patch monitor per enrollment. Additional stickers are not available. Please do not apply patch if you will be having a Nuclear Stress Test,  Echocardiogram, Cardiac CT, MRI, or Chest Xray during the period you would be wearing the  monitor. The patch cannot be worn during these tests. You cannot remove and re-apply the  ZIO XT patch monitor.  Your ZIO patch monitor will be mailed 3 day USPS to your address on file. It may take 3-5 days  to receive your monitor after you have been enrolled.  Once you have received your monitor, please review the enclosed instructions. Your monitor  has already been registered assigning a specific monitor serial # to you.  Billing and Patient Assistance Program Information  We have supplied [employer] with any of your insurance information on file for billing purposes. [employer] offers a sliding scale Patient Assistance Program for patients that do not have  insurance, or whose insurance does not completely cover the cost of the ZIO monitor.  You must apply for the Patient Assistance Program to qualify for this discounted rate.  To apply, please call [employer] at [phone], select option 4, select option 2, ask to apply for  Patient Assistance Program. Meredeth will ask your household income,  and how many people  are in your household. They will quote your out-of-pocket cost based on that information.  [employer] will also be able to set up a 31-month, interest-free payment plan if needed.  Applying the monitor   Shave hair from upper left chest.  Hold abrader disc by orange tab. Rub abrader in 40 strokes  over the upper left chest as  indicated in your monitor instructions.  Clean area with 4 enclosed alcohol pads. Let dry.  Apply patch as indicated in monitor instructions. Patch will be placed under collarbone on left  side of chest with arrow pointing upward.  Rub patch adhesive wings for 2 minutes. Remove white label marked 1. Remove the white  label marked 2. Rub patch adhesive wings for 2 additional minutes.  While looking in a mirror, press and release button in center of patch. A small green light will  flash 3-4 times. This will be your only indicator that the monitor has been turned on.  Do not shower for the first 24 hours. You may shower after the first 24 hours.  Press the button if you feel a symptom. You will hear a small click. Record Date, Time and  Symptom in the Patient Logbook.  When you are ready to remove the patch, follow instructions on the last 2 pages of Patient  Logbook. Stick patch monitor onto the last page of Patient Logbook.  Place Patient Logbook in the blue and white box. Use locking tab on box and tape box closed  securely. The blue and white box has prepaid postage on it. Please place it in the mailbox as  soon as possible. Your physician should have your test results approximately 7 days after the  monitor has been mailed back to [facility].  Call [facility] Customer Care at [phone] if you have questions regarding  your ZIO XT patch monitor. Call them immediately if you see an orange light blinking on your  monitor.  If your monitor falls off in less than 4 days, contact our Monitor department at [phone].  If your monitor becomes loose or falls off after 4 days call [employer] at [phone] for  suggestions on securing your monitor  Your next appointment:   2 month(s)  Provider:   Newman JINNY Lawrence, MD   We recommend signing up for the patient portal called MyChart.  Sign up information is provided on this After Visit  Summary.  MyChart is used to connect with patients for Virtual Visits (Telemedicine).  Patients are able to view lab/test results, encounter notes, upcoming appointments, etc.  Non-urgent messages can be sent to your provider as well.   To learn more about what you can do with MyChart, go to [email or web address].

## 2024-06-01 NOTE — Progress Notes (Unsigned)
Enrolled for [employer] to mail a ZIO XT long term holter monitor to the patients address on file.   Dr. Rosemary Holms to read.

## 2024-06-11 ENCOUNTER — Telehealth (HOSPITAL_COMMUNITY): Payer: Self-pay

## 2024-06-11 ENCOUNTER — Telehealth: Payer: Self-pay | Admitting: Emergency Medicine

## 2024-06-11 ENCOUNTER — Other Ambulatory Visit: Payer: Self-pay

## 2024-06-11 NOTE — Telephone Encounter (Signed)
 Patient reports that he had two episodes of what he thinks was AFIB. He states that he believes these were caused by vascepa  that was started at his last office visit. He was not wearing his heart monitor yet during these episodes so he wanted to let us  know. He has decided to stop taking vascepa . He does have his heart monitor on now. He will continue wearing it and monitor his symptoms.

## 2024-06-11 NOTE — Telephone Encounter (Signed)
  Pt c/o medication issue:  1. Name of Medication: fish oil    2. How are you currently taking this medication (dosage and times per day)? As written   3. Are you having a reaction (difficulty breathing--STAT)? No   4. What is your medication issue? The patient would like to speak with [name]. He wants to discuss his afib, specifically two prolonged episodes on 09/04 and 09/05. He believes these episodes were caused by taking fish oil , as he researched and found that one of the side effects can be AFib.

## 2024-06-11 NOTE — Telephone Encounter (Signed)
 Attempted to call patient to confirm if he wants to attend cardiac rehab in [place] or here- no answer, left message. Sent MyChart message.

## 2024-06-11 NOTE — Telephone Encounter (Signed)
 Left message for patient with recommendations from Kenzie. Advised to call back with any questions.

## 2024-06-12 ENCOUNTER — Telehealth (HOSPITAL_COMMUNITY): Payer: Self-pay

## 2024-06-12 NOTE — Telephone Encounter (Signed)
 Faxed outside referral for Phase II Cardiac Rehab to [facility].

## 2024-06-14 DIAGNOSIS — M7742 Metatarsalgia, left foot: Secondary | ICD-10-CM | POA: Diagnosis not present

## 2024-06-25 ENCOUNTER — Telehealth (HOSPITAL_COMMUNITY): Payer: Self-pay

## 2024-06-25 NOTE — Telephone Encounter (Signed)
 Attempted f/u call regarding cardiac rehab- no answer, left message. Closing referral.

## 2024-07-04 DIAGNOSIS — R000 Tachycardia, unspecified: Secondary | ICD-10-CM | POA: Diagnosis not present

## 2024-07-09 ENCOUNTER — Ambulatory Visit: Payer: Self-pay | Admitting: Emergency Medicine

## 2024-07-09 DIAGNOSIS — R000 Tachycardia, unspecified: Secondary | ICD-10-CM

## 2024-07-11 ENCOUNTER — Ambulatory Visit (HOSPITAL_COMMUNITY)
Admission: RE | Admit: 2024-07-11 | Discharge: 2024-07-11 | Disposition: A | Discharge status: Home / Self Care | Source: Ambulatory Visit | Attending: Cardiology | Admitting: Cardiology

## 2024-07-11 DIAGNOSIS — R5383 Other fatigue: Secondary | ICD-10-CM | POA: Diagnosis not present

## 2024-07-11 DIAGNOSIS — R011 Cardiac murmur, unspecified: Secondary | ICD-10-CM | POA: Insufficient documentation

## 2024-07-11 LAB — ECHOCARDIOGRAM COMPLETE
Area-P 1/2: 3.6 cm2
S' Lateral: 2.9 cm

## 2024-07-13 NOTE — Telephone Encounter (Signed)
 Pt returning call to nurse. He asked that you call him back after 12:30

## 2024-07-19 ENCOUNTER — Other Ambulatory Visit: Payer: Self-pay

## 2024-07-19 DIAGNOSIS — I480 Paroxysmal atrial fibrillation: Secondary | ICD-10-CM

## 2024-07-19 MED ORDER — APIXABAN 5 MG PO TABS: 5.0000 mg | ORAL_TABLET | Freq: Two times a day (BID) | ORAL | 1 refills | Status: AC

## 2024-07-19 NOTE — Telephone Encounter (Signed)
 Prescription refill request for Eliquis  received. Indication:afib Last office visit:8/25 Scr:0.94  8/25 Age: 78 Weight:74.8  kg  Prescription refilled

## 2024-07-31 ENCOUNTER — Ambulatory Visit (HOSPITAL_COMMUNITY)
Admission: RE | Admit: 2024-07-31 | Discharge: 2024-07-31 | Disposition: A | Discharge status: Home / Self Care | Source: Ambulatory Visit | Attending: Cardiology | Admitting: Cardiology

## 2024-07-31 DIAGNOSIS — I6523 Occlusion and stenosis of bilateral carotid arteries: Secondary | ICD-10-CM | POA: Diagnosis not present

## 2024-08-04 LAB — CBC WITH DIFFERENTIAL/PLATELET
Basophils Absolute: 0.1 x10E3/uL (ref 0.0–0.2)
Basos: 1 %
EOS (ABSOLUTE): 0.2 x10E3/uL (ref 0.0–0.4)
Eos: 2 %
Hematocrit: 39.8 % (ref 37.5–51.0)
Hemoglobin: 13.4 g/dL (ref 13.0–17.7)
Immature Grans (Abs): 0 x10E3/uL (ref 0.0–0.1)
Immature Granulocytes: 0 %
Lymphocytes Absolute: 3.4 x10E3/uL — ABNORMAL HIGH (ref 0.7–3.1)
Lymphs: 40 %
MCH: 30.9 pg (ref 26.6–33.0)
MCHC: 33.7 g/dL (ref 31.5–35.7)
MCV: 92 fL (ref 79–97)
Monocytes Absolute: 1 x10E3/uL — ABNORMAL HIGH (ref 0.1–0.9)
Monocytes: 11 %
Neutrophils Absolute: 4 x10E3/uL (ref 1.4–7.0)
Neutrophils: 46 %
Platelets: 347 x10E3/uL (ref 150–450)
RBC: 4.34 x10E6/uL (ref 4.14–5.80)
RDW: 12.6 % (ref 11.6–15.4)
WBC: 8.6 x10E3/uL (ref 3.4–10.8)

## 2024-08-04 LAB — HEPATIC FUNCTION PANEL
ALT: 10 IU/L (ref 0–44)
AST: 16 IU/L (ref 0–40)
Albumin: 4.5 g/dL (ref 3.8–4.8)
Alkaline Phosphatase: 59 IU/L (ref 47–123)
Bilirubin Total: 0.2 mg/dL (ref 0.0–1.2)
Bilirubin, Direct: 0.1 mg/dL (ref 0.00–0.40)
Total Protein: 6.6 g/dL (ref 6.0–8.5)

## 2024-08-08 ENCOUNTER — Encounter: Payer: Self-pay | Admitting: Cardiology

## 2024-08-08 ENCOUNTER — Ambulatory Visit: Attending: Cardiology | Admitting: Cardiology

## 2024-08-08 VITALS — BP 130/76 | HR 55 | Ht 67.0 in | Wt 163.0 lb

## 2024-08-08 DIAGNOSIS — I480 Paroxysmal atrial fibrillation: Secondary | ICD-10-CM | POA: Diagnosis not present

## 2024-08-08 DIAGNOSIS — I25118 Atherosclerotic heart disease of native coronary artery with other forms of angina pectoris: Secondary | ICD-10-CM

## 2024-08-08 DIAGNOSIS — E782 Mixed hyperlipidemia: Secondary | ICD-10-CM

## 2024-08-08 MED ORDER — METOPROLOL TARTRATE 25 MG PO TABS: 25.0000 mg | ORAL_TABLET | Freq: Two times a day (BID) | ORAL | 3 refills | Status: AC

## 2024-08-08 NOTE — Patient Instructions (Signed)
 Medication Instructions:  No Changes  Lab Work: In 3 MONTHS: FASTING LIPID PANEL (okay to drink water  and/or black coffee) DUE around Nov 08, 2024  Follow-Up: At [facility], you and your health needs are our priority.  As part of our continuing mission to provide you with exceptional heart care, our providers are all part of one team.  This team includes your primary Cardiologist (physician) and Advanced Practice Providers or APPs (Physician Assistants and Nurse Practitioners) who all work together to provide you with the care you need, when you need it.  Your next appointment:   6 month(s)  Provider:   Newman JINNY Lawrence, MD

## 2024-08-08 NOTE — Progress Notes (Signed)
 Cardiology Office Note:  .   Date:  08/08/2024  ID:  Mark Garner Sensor, DOB 21-May-1946, MRN [id number] PCP: Regino Slater, MD  [facility] Providers Cardiologist:  Newman Lawrence, MD PCP: Regino Slater, MD  Chief Complaint  Patient presents with   Coronary Artery Disease     Mark Garner is a 78 y.o. male with mixed hyperlipidemia, coronary artery disease, paroxysmal atrial fibrillation  History of Present Illness  Patient underwent complex PCI with stenting to proximal left circumflex and DCB to distal LAD stent in 05/2024.  He had 1 episode of chest pain since he required nitroglycerin , but has been doing well recently.  He wants to increase his physical activity as tolerated.  He has not had any recent chest pain or shortness of breath symptoms.  He complains of fatigue, but has not been as active over the past 1 year for variety of reasons.      Vitals:   08/08/24 1013 08/08/24 1044  BP: (!) 144/66 130/76  Pulse: (!) 55   SpO2: 97%        Review of Systems  Cardiovascular:  Negative for chest pain, dyspnea on exertion, leg swelling, palpitations and syncope.        Studies Reviewed: SABRA    EKG 05/09/2024: Sinus rhythm 56 bpm Right bundle branch block Left anterior fascicular block Bifascicular block Septal infarct (cited on or before 11-May-2022) When compared with ECG of 13-Dec-2023 11:03, No significant change was found  Echocardiogram 07/2024:  1. Left ventricular ejection fraction, by estimation, is 55 to 60%. Left  ventricular ejection fraction by 3D volume is 59 %. The left ventricle has  normal function. The left ventricle has no regional wall motion  abnormalities. There is mild asymmetric  left ventricular hypertrophy of the basal-septal segment. Left ventricular  diastolic parameters are consistent with Grade II diastolic dysfunction  (pseudonormalization). The average left ventricular global longitudinal  strain is -20.8 %. The  global  longitudinal strain is normal.   2. Right ventricular systolic function is normal. The right ventricular  size is mildly enlarged.   3. Left atrial size was mildly dilated.   4. The mitral valve is normal in structure. No evidence of mitral valve  regurgitation. No evidence of mitral stenosis.   5. The aortic valve is tricuspid. There is moderate calcification of the  aortic valve. Aortic valve regurgitation is mild. Aortic valve  sclerosis/calcification is present, without any evidence of aortic  stenosis.   6. The inferior vena cava is normal in size with greater than 50%  respiratory variability, suggesting right atrial pressure of 3 mmHg.    Coronary angiography & intervention 05/15/2024: LM: No significant disease LAD: Prox 30% disease          Mid and distal LAD stents          Mid LAD stent with 70% ISR, FFR 0.78 (physiologically significant) Lcx: Prox 80% stenosis         Mid Lcx stent with no significant restenosis         50% distal Lcx disease Ramus: Prox 90% stenosis RCA: large, dominant vessel          Prox-mid 40%, distal 50% disease   LVEDP 13 mmHg   Successful percutaneous coronary intervention         PTCA and stent placement 3.0 X 20 mm Synergy drug-eluting stent prox LCx        PTCA and stent placement 3.0 X 16 mm  Synergy drug-eluting stent prox Ramus        PTCA and PTCA 2.5 X 20 mm Agent DCB mid LAD           Long-term monitor 08/2023:   Predominant rhythm is sinus rhythm.   70 episodes of nonsustained supraventricular tachycardia.  the fasterest interval lasted 5 beats with a max HR of 218.  The longest episode lasted 17 seconds with Avg HR of 100   Rare PACs, Rare PVCs    Stress test 08/2023:   The study is normal. The study is low risk.   Fair exercise capacity, achieved 7.0 METS   Peak heart rate 151 bpm, 106% max age-predicted heart rate   Hypertensive response to exercise, peak BP 209/99   horizontal ST depression (V3) was noted.    LV perfusion is normal. There is no evidence of ischemia. There is no evidence of infarction.   Left ventricular function is normal. Nuclear stress EF: 61%. The left ventricular ejection fraction is normal (55-65%). End diastolic cavity size is normal. End systolic cavity size is normal.   Prior study available for comparison from 05/11/2022.   Fixed inferior perfusion defect with normal wall motion, consistent with artifact LVEF 61% Low risk study  Echocardiogram 2023:  1. Left ventricular ejection fraction, by estimation, is 60 to 65%. The  left ventricle has normal function. The left ventricle has no regional  wall motion abnormalities. Left ventricular diastolic parameters are  consistent with Grade I diastolic  dysfunction (impaired relaxation).   2. Right ventricular systolic function is normal. The right ventricular  size is normal.   3. The mitral valve is normal in structure. Trivial mitral valve  regurgitation. No evidence of mitral stenosis.   4. The aortic valve is calcified. Aortic valve regurgitation is not  visualized. Aortic valve sclerosis/calcification is present, without any  evidence of aortic stenosis.   5. The inferior vena cava is normal in size with greater than 50%  respiratory variability, suggesting right atrial pressure of 3 mmHg.   Coronary angiogram 2020: 1. Multivessel CAD in a diffuse, small vessel disease pattern 2. Severe mid-circumflex stenosis treated successfully with PTCA and DES implantation (2.25x22 mm Resolute Onyx) 3. Severe mid and distal LAD stenoses successfully treated with PTCA and stenting using a 2.5x22 mm Resolute Onyx and 2.0x15 mm Resolute Onyx, respectively 4. Diffuse nonobstructive RCA stenosis with severe stenosis of the ostial right PDA not favorable for PCI in small vessel with ostial lesion location 5. Moderate residual disease in the ramus intermedius   Recommend: 2D Echo for LVEF assessment, ASA/ticagrelor  x 12 months minimum,  med Rx for residual CAD, aggressive lipid lowering as tolerated    Labs 06/2023-10/2023: Chol 205, TG 206, HDL 35, LDL 133 HbA1C 6.1% Hb 13.2 Cr 0.8 TSH 2.7  Risk Assessment/Calculations:    CHA2DS2-VASc Score = 4   This indicates a 4.8% annual risk of stroke. The patient's score is based upon: CHF History: 0 HTN History: 1 Diabetes History: 0 Stroke History: 0 Vascular Disease History: 1 Age Score: 2 Gender Score: 0     Physical Exam Vitals and nursing note reviewed.  Constitutional:      General: He is not in acute distress. Neck:     Vascular: No JVD.  Cardiovascular:     Rate and Rhythm: Normal rate and regular rhythm.     Heart sounds: Normal heart sounds. No murmur heard. Pulmonary:     Effort: Pulmonary effort is normal.  Breath sounds: Normal breath sounds. No wheezing or rales.  Musculoskeletal:     Right lower leg: No edema.     Left lower leg: No edema.      VISIT DIAGNOSES:   ICD-10-CM   1. Mixed hyperlipidemia  E78.2 Lipid panel    2. Coronary artery disease of native artery of native heart with stable angina pectoris  I25.118     3. PAF (paroxysmal atrial fibrillation) (HCC)  I48.0         SHERILL WEGENER is a 78 y.o. male with mixed hyperlipidemia, coronary artery disease, paroxysmal atrial fibrillation Assessment & Plan  CAD: PCI with stenting to proximal physical face and DCB to distal LAD in 05/2024. No anginal symptoms at this time. Continue Plavix  75 mg daily, metoprolol  tartrate 25 mg twice daily, subcu nitroglycerin  as needed. LDL well-controlled, triglyceride remain slightly elevated. Recommend diet low in simple carbohydrate and high saturated fat. Repeat fasting profile in 3 months. Of note, he did not tolerate Vascepa  in the past due to an episode of A-fib that he attributed to Vascepa .  PAF: Continue Eliquis  5 mg twice daily. Continue metoprolol  to tartrate 25 mg twice daily.  Carotid stenosis: Mild.  Continue medical  management.    Meds ordered this encounter  Medications   metoprolol  tartrate (LOPRESSOR ) 25 MG tablet    Sig: Take 1 tablet (25 mg total) by mouth 2 (two) times daily.    Dispense:  180 tablet    Refill:  3     F/u in 6 months  Signed, Newman JINNY Lawrence, MD

## 2024-09-03 ENCOUNTER — Encounter (HOSPITAL_COMMUNITY)

## 2024-10-01 ENCOUNTER — Other Ambulatory Visit: Payer: Self-pay

## 2024-10-02 ENCOUNTER — Telehealth: Payer: Self-pay | Admitting: Cardiology

## 2024-10-02 MED ORDER — CLOPIDOGREL BISULFATE 75 MG PO TABS: 75.0000 mg | ORAL_TABLET | Freq: Every day | ORAL | 3 refills | Status: AC

## 2024-10-02 NOTE — Telephone Encounter (Signed)
" °*  STAT* If patient is at the pharmacy, call can be transferred to refill team.   1. Which medications need to be refilled? (please list name of each medication and dose if known)   clopidogrel  (PLAVIX ) 75 MG tablet     2. Would you like to learn more about the convenience, safety, & potential cost savings by using the [facility]? No   3. Are you open to using the [facility] (Type [facility].  ) No   4. Which pharmacy/location (including street and city if local pharmacy) is medication to be sent to?  [id number] - [place], [place] - [address]   5. Do they need a 30 day or 90 day supply? 90 day  Pt is out of medication  "

## 2024-10-03 NOTE — Telephone Encounter (Signed)
"   Disp Refills Start End   clopidogrel  (PLAVIX ) 75 MG tablet 90 tablet 3 10/02/2024 --   Sig - Route: Take 1 tablet (75 mg total) by mouth daily. - Oral   Sent to pharmacy as: clopidogrel  (PLAVIX ) 75 MG tablet   E-Prescribing Status: Receipt confirmed by pharmacy (10/02/2024 10:36 AM EST)    "

## 2024-10-16 ENCOUNTER — Other Ambulatory Visit: Payer: Self-pay | Admitting: *Deleted

## 2024-10-16 DIAGNOSIS — E782 Mixed hyperlipidemia: Secondary | ICD-10-CM
